# Patient Record
Sex: Female | Born: 1990 | Race: Black or African American | Hispanic: No | Marital: Single | Smoking: Never smoker
Health system: Southern US, Community
[De-identification: ages and names within clinical notes are randomized; demographics above are authoritative.]

## PROBLEM LIST (undated history)

## (undated) DIAGNOSIS — Z789 Other specified health status: Secondary | ICD-10-CM

---
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
## 2008-07-11 ENCOUNTER — Emergency Department: Payer: Self-pay | Admitting: Emergency Medicine

## 2008-08-25 ENCOUNTER — Emergency Department: Payer: Self-pay | Admitting: Emergency Medicine

## 2008-09-17 ENCOUNTER — Ambulatory Visit: Payer: Self-pay

## 2008-10-01 ENCOUNTER — Ambulatory Visit: Payer: Self-pay | Admitting: Gynecologic Oncology

## 2008-10-23 ENCOUNTER — Ambulatory Visit: Payer: Self-pay | Admitting: Gynecologic Oncology

## 2008-10-29 ENCOUNTER — Ambulatory Visit: Payer: Self-pay | Admitting: Gynecologic Oncology

## 2009-11-24 ENCOUNTER — Emergency Department: Payer: Self-pay | Admitting: Emergency Medicine

## 2010-01-31 ENCOUNTER — Emergency Department: Payer: Self-pay | Admitting: Emergency Medicine

## 2010-03-30 ENCOUNTER — Emergency Department: Payer: Self-pay | Admitting: Emergency Medicine

## 2010-08-10 ENCOUNTER — Observation Stay: Payer: Self-pay | Admitting: Obstetrics and Gynecology

## 2010-08-14 ENCOUNTER — Inpatient Hospital Stay: Payer: Self-pay

## 2010-11-17 ENCOUNTER — Emergency Department: Payer: Self-pay | Admitting: Emergency Medicine

## 2011-01-11 ENCOUNTER — Emergency Department: Payer: Self-pay | Admitting: Emergency Medicine

## 2011-04-12 ENCOUNTER — Emergency Department: Payer: Self-pay | Admitting: Emergency Medicine

## 2012-01-13 ENCOUNTER — Emergency Department: Payer: Self-pay

## 2012-09-19 ENCOUNTER — Emergency Department: Payer: Self-pay | Admitting: Emergency Medicine

## 2012-09-19 LAB — URINALYSIS, COMPLETE
Bilirubin,UR: NEGATIVE
Ketone: NEGATIVE
Nitrite: NEGATIVE
RBC,UR: 44 /HPF (ref 0–5)
Specific Gravity: 1.033 (ref 1.003–1.030)

## 2012-09-19 LAB — CBC
HCT: 38.9 % (ref 35.0–47.0)
HGB: 13.7 g/dL (ref 12.0–16.0)
MCH: 32.2 pg (ref 26.0–34.0)
MCHC: 35.2 g/dL (ref 32.0–36.0)
RBC: 4.25 10*6/uL (ref 3.80–5.20)
RDW: 12.7 % (ref 11.5–14.5)

## 2012-09-19 LAB — COMPREHENSIVE METABOLIC PANEL
Albumin: 3.8 g/dL (ref 3.4–5.0)
Alkaline Phosphatase: 88 U/L (ref 50–136)
Calcium, Total: 8.9 mg/dL (ref 8.5–10.1)
Co2: 22 mmol/L (ref 21–32)
Creatinine: 0.8 mg/dL (ref 0.60–1.30)
EGFR (African American): >60
EGFR (Non-African Amer.): >60
Osmolality: 277 (ref 275–301)
SGOT(AST): 18 U/L (ref 15–37)
SGPT (ALT): 25 U/L (ref 12–78)

## 2012-09-19 LAB — PREGNANCY, URINE: Pregnancy Test, Urine: NEGATIVE m[IU]/mL

## 2012-09-19 LAB — LIPASE, BLOOD: Lipase: 81 U/L (ref 73–393)

## 2013-12-05 ENCOUNTER — Emergency Department: Payer: Self-pay | Admitting: Emergency Medicine

## 2015-02-22 ENCOUNTER — Other Ambulatory Visit: Payer: Self-pay

## 2015-02-22 DIAGNOSIS — T148 Other injury of unspecified body region: Secondary | ICD-10-CM | POA: Insufficient documentation

## 2015-02-22 DIAGNOSIS — Z87891 Personal history of nicotine dependence: Secondary | ICD-10-CM | POA: Insufficient documentation

## 2015-02-22 DIAGNOSIS — Y9289 Other specified places as the place of occurrence of the external cause: Secondary | ICD-10-CM | POA: Insufficient documentation

## 2015-02-22 DIAGNOSIS — M545 Low back pain: Secondary | ICD-10-CM | POA: Insufficient documentation

## 2015-02-22 DIAGNOSIS — Z3202 Encounter for pregnancy test, result negative: Secondary | ICD-10-CM | POA: Insufficient documentation

## 2015-02-22 DIAGNOSIS — Y990 Civilian activity done for income or pay: Secondary | ICD-10-CM | POA: Insufficient documentation

## 2015-02-22 DIAGNOSIS — Y9389 Activity, other specified: Secondary | ICD-10-CM | POA: Insufficient documentation

## 2015-02-22 DIAGNOSIS — X58XXXA Exposure to other specified factors, initial encounter: Secondary | ICD-10-CM | POA: Insufficient documentation

## 2015-02-22 LAB — COMPREHENSIVE METABOLIC PANEL
ALBUMIN: 4 g/dL (ref 3.5–5.0)
ALK PHOS: 70 U/L (ref 38–126)
ALT: 36 U/L (ref 14–54)
AST: 29 U/L (ref 15–41)
Anion gap: 8 (ref 5–15)
BUN: 8 mg/dL (ref 6–20)
CHLORIDE: 108 mmol/L (ref 101–111)
CO2: 24 mmol/L (ref 22–32)
Calcium: 9 mg/dL (ref 8.9–10.3)
Creatinine, Ser: 1 mg/dL (ref 0.44–1.00)
GFR calc Af Amer: >60 mL/min (ref >60)
GFR calc non Af Amer: >60 mL/min (ref >60)
Glucose, Bld: 102 mg/dL — ABNORMAL HIGH (ref 65–99)
Potassium: 3.6 mmol/L (ref 3.5–5.1)
Sodium: 140 mmol/L (ref 135–145)
TOTAL PROTEIN: 8.1 g/dL (ref 6.5–8.1)
Total Bilirubin: 0.5 mg/dL (ref 0.3–1.2)

## 2015-02-22 LAB — CBC WITH DIFFERENTIAL/PLATELET
BASOS ABS: 0 10*3/uL (ref 0–0.1)
Basophils Relative: 1 %
EOS ABS: 1 10*3/uL — AB (ref 0–0.7)
EOS PCT: 15 %
HCT: 40.6 % (ref 35.0–47.0)
Hemoglobin: 13.7 g/dL (ref 12.0–16.0)
LYMPHS PCT: 29 %
Lymphs Abs: 1.8 10*3/uL (ref 1.0–3.6)
MCH: 31.4 pg (ref 26.0–34.0)
MCHC: 33.6 g/dL (ref 32.0–36.0)
MCV: 93.5 fL (ref 80.0–100.0)
Monocytes Absolute: 0.5 10*3/uL (ref 0.2–0.9)
Monocytes Relative: 8 %
NEUTROS PCT: 47 %
Neutro Abs: 3 10*3/uL (ref 1.4–6.5)
PLATELETS: 233 10*3/uL (ref 150–440)
RBC: 4.35 MIL/uL (ref 3.80–5.20)
RDW: 12.9 % (ref 11.5–14.5)
WBC: 6.4 10*3/uL (ref 3.6–11.0)

## 2015-02-22 LAB — URINALYSIS COMPLETE WITH MICROSCOPIC (ARMC ONLY)
BILIRUBIN URINE: NEGATIVE
Bacteria, UA: NONE SEEN
Glucose, UA: NEGATIVE mg/dL
HGB URINE DIPSTICK: NEGATIVE
KETONES UR: NEGATIVE mg/dL
Nitrite: NEGATIVE
Protein, ur: NEGATIVE mg/dL
Specific Gravity, Urine: 1.025 (ref 1.005–1.030)
pH: 5 (ref 5.0–8.0)

## 2015-02-22 LAB — LIPASE, BLOOD: Lipase: 33 U/L (ref 22–51)

## 2015-02-22 NOTE — ED Notes (Signed)
Pt to ED c/o generalized body aches and upper abdominal pain. Pt is A&O, speaking clearly. Denies any n/v/d.

## 2015-02-23 ENCOUNTER — Emergency Department
Admission: EM | Admit: 2015-02-23 | Discharge: 2015-02-23 | Disposition: A | Discharge status: Home / Self Care | Payer: Self-pay | Attending: Emergency Medicine | Admitting: Emergency Medicine

## 2015-02-23 ENCOUNTER — Encounter: Payer: Self-pay | Admitting: Emergency Medicine

## 2015-02-23 DIAGNOSIS — T148XXA Other injury of unspecified body region, initial encounter: Secondary | ICD-10-CM

## 2015-02-23 LAB — PREGNANCY, URINE: Preg Test, Ur: NEGATIVE

## 2015-02-23 NOTE — ED Provider Notes (Signed)
[facility] Emergency Department Provider Note  ____________________________________________  Time seen: Approximately 12:27 AM  I have reviewed the triage vital signs and the nursing notes.   HISTORY  Chief Complaint Abdominal Pain and Muscle Pain    HPI Andrea Rivera is a 24 y.o. female with no significant past medical history presents with 2 weeks of intermittent mild to moderate upper abdominal/bilateral lower rib pain.Chest he complains of mild intermittent pain in her extremities and her lower back that she believes is related to the heavy lifting that she does not work.  She states that actually she does not have any pain at all right now but just wanted to get checked out.  She also states that she is worried she might be pregnant because she missed her Depo-Provera shot.  She denies fever/chills, chest pain, shortness of breath, lower abdominal pain, and dysuria.  The onset of her symptoms is gradual, the timing is intermittent, and the severity is mild.  Describes the back pain as aching.  History reviewed. No pertinent past medical history.  There are no active problems to display for this patient.   History reviewed. No pertinent past surgical history.  No current outpatient prescriptions on file.  Allergies Review of patient's allergies indicates no known allergies.  History reviewed. No pertinent family history.  Social History History  Substance Use Topics  . Smoking status: Former Research scientist (life sciences)  . Smokeless tobacco: Not on file  . Alcohol Use: No    Review of Systems Constitutional: No fever/chills Eyes: No visual changes. ENT: No sore throat. Cardiovascular: Denies chest pain. Respiratory: Denies shortness of breath. Gastrointestinal: No abdominal pain.  No nausea, no vomiting.  No diarrhea.  No constipation. Genitourinary: Negative for dysuria. Musculoskeletal: Intermittent aching lower back pain Skin: Negative for  rash. Neurological: Negative for headaches, focal weakness or numbness.  10-point ROS otherwise negative.  ____________________________________________   PHYSICAL EXAM:  VITAL SIGNS: ED Triage Vitals  Enc Vitals Group     BP 02/22/15 1950 135/75 mmHg     Pulse Rate 02/22/15 1950 76     Resp 02/22/15 1950 18     Temp 02/22/15 1950 98.1 F (36.7 C)     Temp Source 02/22/15 1950 Oral     SpO2 02/22/15 1950 100 %     Weight 02/22/15 1950 200 lb (90.719 kg)     Height 02/22/15 1950 5\' 4"  (1.626 m)     Head Cir --      Peak Flow --      Pain Score 02/22/15 1951 8     Pain Loc --      Pain Edu? --      Excl. in [place]? --     Constitutional: Alert and oriented. Well appearing and in no acute distress. Eyes: Conjunctivae are normal. PERRL. EOMI. Head: Atraumatic. Nose: No congestion/rhinnorhea. Mouth/Throat: Mucous membranes are moist.  Oropharynx non-erythematous. Neck: No stridor.   Cardiovascular: Normal rate, regular rhythm. Grossly normal heart sounds.  Good peripheral circulation. Respiratory: Normal respiratory effort.  No retractions. Lungs CTAB. Gastrointestinal: Soft and nontender. No distention. No abdominal bruits. No CVA tenderness. Musculoskeletal: No lower extremity tenderness nor edema.  No joint effusions. Neurologic:  Normal speech and language. No gross focal neurologic deficits are appreciated. Speech is normal. Skin:  Skin is warm, dry and intact. No rash noted. Psychiatric: Mood and affect are normal. Speech and behavior are normal.  ____________________________________________   LABS (all labs ordered are listed, but only abnormal results  are displayed)  Labs Reviewed  CBC WITH DIFFERENTIAL/PLATELET - Abnormal; Notable for the following:    Eosinophils Absolute 1.0 (*)    All other components within normal limits  COMPREHENSIVE METABOLIC PANEL - Abnormal; Notable for the following:    Glucose, Bld 102 (*)    All other components within normal limits   URINALYSIS COMPLETEWITH MICROSCOPIC ([facility] ONLY) - Abnormal; Notable for the following:    Color, Urine YELLOW (*)    APPearance CLOUDY (*)    Leukocytes, UA 1+ (*)    Squamous Epithelial / LPF 6-30 (*)    All other components within normal limits  URINE CULTURE  LIPASE, BLOOD  PREGNANCY, URINE  POC URINE PREG, ED   ____________________________________________  EKG  ED ECG REPORT I, Caiden Arteaga, the attending physician, personally viewed and interpreted this ECG.   Date: 02/23/2015  EKG Time: 20:11  Rate: 70  Rhythm: normal sinus rhythm  Axis: Normal  Intervals:Incomplete right bundle branch block  ST&T Change: No indication of acute ischemia  ____________________________________________  RADIOLOGY  Not indicated  ____________________________________________   PROCEDURES  Procedure(s) performed: None  Critical Care performed: No ____________________________________________   INITIAL IMPRESSION / ASSESSMENT AND PLAN / ED COURSE  Pertinent labs & imaging results that were available during my care of the patient were reviewed by me and considered in my medical decision making (see chart for details).  The patient is well-appearing and in no acute distress with no complaints at this time.  She needed reassurance that she has no acute medical problems at this time, and I assured her that she does not have an emergent medical condition.  Her urinalysis was contaminated with squamous cells and I do not believe that she has an actual UTI given no other symptoms.  I am sending the urine to culture rather than treating her empirically.  I discussed this with the patient and she understands.  I advised following up with a primary care doctor to talk about her health maintenance in detail  ____________________________________________  FINAL CLINICAL IMPRESSION(S) / ED DIAGNOSES  Final diagnoses:  Muscle strain      NEW MEDICATIONS STARTED DURING THIS  VISIT:  There are no discharge medications for this patient.    Hinda Kehr, MD 02/24/15 [phone]

## 2015-02-23 NOTE — ED Notes (Signed)
Pt states pain intermittently x 2 weeks. Pt states upper abdominal pain/ bilateral lower rib pain. Pt states multiple complaints including bilateral lower leg pain, foot pain, shoulder pain and possible pregnancy related to missing depo shot. Pt states she has recently been lifting heavy items at work and believes the pain is related to her range of motion required by her work and the amount of time she spends standing but she "wanted to get checked out because" she "never does".

## 2015-02-23 NOTE — Discharge Instructions (Signed)
Muscle Strain A muscle strain (pulled muscle) happens when a muscle is stretched beyond normal length. It happens when a sudden, violent force stretches your muscle too far. Usually, a few of the fibers in your muscle are torn. Muscle strain is common in athletes. Recovery usually takes 1-2 weeks. Complete healing takes 5-6 weeks.  HOME CARE   Follow the PRICE method of treatment to help your injury get better. Do this the first 2-3 days after the injury:  Protect. Protect the muscle to keep it from getting injured again.  Rest. Limit your activity and rest the injured body part.  Ice. Put ice in a plastic bag. Place a towel between your skin and the bag. Then, apply the ice and leave it on from 15-20 minutes each hour. After the third day, switch to moist heat packs.  Compression. Use a splint or elastic bandage on the injured area for comfort. Do not put it on too tightly.  Elevate. Keep the injured body part above the level of your heart.  Only take medicine as told by your doctor.  Warm up before doing exercise to prevent future muscle strains. GET HELP IF:   You have more pain or puffiness (swelling) in the injured area.  You feel numbness, tingling, or notice a loss of strength in the injured area. MAKE SURE YOU:   Understand these instructions.  Will watch your condition.  Will get help right away if you are not doing well or get worse. Document Released: 05/26/2008 Document Revised: 06/07/2013 Document Reviewed: 03/16/2013 [facility] Patient Information 2015 [place], [place]. This information is not intended to replace advice given to you by your health care provider. Make sure you discuss any questions you have with your health care provider.

## 2015-02-24 LAB — URINE CULTURE: SPECIAL REQUESTS: NORMAL

## 2015-02-26 LAB — POCT PREGNANCY, URINE: PREG TEST UR: NEGATIVE

## 2015-09-01 NOTE — L&D Delivery Note (Signed)
Delivery Note At 6:45 AM a viable female was delivered via Vaginal, Spontaneous Delivery (Presentation: ;  ).  APGAR: 8, 9; weight 7 lb 14 oz (3572 g).   Placenta status: intact, .  Cord:loose nuchal reduced   with the following complications: None .  terminal variable decels . Female crying on delivery . Delayed cord clamping  Anesthesia:  cle Episiotomy:  none Lacerations:  First degree Suture Repair: 3.0 vicryl Est. Blood Loss (mL):  300 cc  Mom to postpartum.  Baby to Couplet care / Skin to Skin.  Mariette Cowley 06/07/2016, 7:00 AM

## 2015-12-18 ENCOUNTER — Encounter: Payer: Self-pay | Admitting: Emergency Medicine

## 2015-12-18 ENCOUNTER — Emergency Department
Admission: EM | Admit: 2015-12-18 | Discharge: 2015-12-18 | Disposition: A | Discharge status: Home / Self Care | Payer: Medicaid Other | Attending: Emergency Medicine | Admitting: Emergency Medicine

## 2015-12-18 ENCOUNTER — Emergency Department: Payer: Medicaid Other

## 2015-12-18 DIAGNOSIS — R1030 Lower abdominal pain, unspecified: Secondary | ICD-10-CM

## 2015-12-18 DIAGNOSIS — Z3A15 15 weeks gestation of pregnancy: Secondary | ICD-10-CM | POA: Diagnosis not present

## 2015-12-18 DIAGNOSIS — B9689 Other specified bacterial agents as the cause of diseases classified elsewhere: Secondary | ICD-10-CM

## 2015-12-18 DIAGNOSIS — R109 Unspecified abdominal pain: Secondary | ICD-10-CM

## 2015-12-18 DIAGNOSIS — A599 Trichomoniasis, unspecified: Secondary | ICD-10-CM | POA: Diagnosis not present

## 2015-12-18 DIAGNOSIS — O26899 Other specified pregnancy related conditions, unspecified trimester: Secondary | ICD-10-CM

## 2015-12-18 DIAGNOSIS — O23591 Infection of other part of genital tract in pregnancy, first trimester: Secondary | ICD-10-CM | POA: Diagnosis not present

## 2015-12-18 DIAGNOSIS — Z87891 Personal history of nicotine dependence: Secondary | ICD-10-CM | POA: Diagnosis not present

## 2015-12-18 DIAGNOSIS — N760 Acute vaginitis: Secondary | ICD-10-CM

## 2015-12-18 LAB — URINALYSIS COMPLETE WITH MICROSCOPIC (ARMC ONLY)
Bilirubin Urine: NEGATIVE
Glucose, UA: NEGATIVE mg/dL
HGB URINE DIPSTICK: NEGATIVE
NITRITE: NEGATIVE
PH: 6 (ref 5.0–8.0)
PROTEIN: NEGATIVE mg/dL
SPECIFIC GRAVITY, URINE: 1.025 (ref 1.005–1.030)

## 2015-12-18 LAB — WET PREP, GENITAL
SPERM: NONE SEEN
Yeast Wet Prep HPF POC: NONE SEEN

## 2015-12-18 LAB — HCG, QUANTITATIVE, PREGNANCY: hCG, Beta Chain, Quant, S: 54817 m[IU]/mL — ABNORMAL HIGH (ref <5)

## 2015-12-18 LAB — CHLAMYDIA/NGC RT PCR (ARMC ONLY)
CHLAMYDIA TR: NOT DETECTED
N gonorrhoeae: NOT DETECTED

## 2015-12-18 MED ORDER — METRONIDAZOLE 500 MG PO TABS: 500.0000 mg | ORAL_TABLET | Freq: Once | ORAL | Status: DC

## 2015-12-18 MED ORDER — METRONIDAZOLE 500 MG PO TABS: 500.0000 mg | ORAL_TABLET | Freq: Two times a day (BID) | ORAL | Status: AC

## 2015-12-18 MED ORDER — ACETAMINOPHEN 325 MG PO TABS
650.0000 mg | ORAL_TABLET | Freq: Once | ORAL | Status: DC
  Filled 2015-12-18: qty 2

## 2015-12-18 NOTE — ED Notes (Signed)
Abdominal pain-mid to lower. Denies bleeding or discharge. Approx 3 mo pregnant.

## 2015-12-18 NOTE — ED Notes (Addendum)
Patient ambulatory to triage with steady gait, without difficulty or distress noted; pt reports went on carnival ride and since has had mid lower abd pain, no bleeding or discharge; pt approx 55months pregnant; G2 P1; pt at [facility] with no complications

## 2015-12-18 NOTE — ED Provider Notes (Signed)
[facility] Emergency Department Provider Note  ____________________________________________  Time seen: Approximately 258 AM  I have reviewed the triage vital signs and the nursing notes.   HISTORY  Chief Complaint Abdominal Pain    HPI Andrea Rivera is a 25 y.o. female who comes into the hospital today with some lower abdominal pain. The patient reports that started today and is sharp and crampy. She reports that she is 52-months pregnant and goes to [place]. The patient is a G2 P1 001. She reports that her pain in his lower abdomen and is currently a 5 out of 10 in intensity. She denies any pain with urination and reports that she has not taken anything for her pain. She reports that she did not call [facility] as the pain started later in the evening. The patient does not have any vaginal discharge she reports that is abnormal. She's had no fevers no back pain no chest pain or shortness of breath. The patient reports she is not sure what going on she decided to come in and get checked out. She is having no vaginal bleeding as well.   History reviewed. No pertinent past medical history.  There are no active problems to display for this patient.   History reviewed. No pertinent past surgical history.  Current Outpatient Rx  Name  Route  Sig  Dispense  Refill  . Prenat-FeFum-FePo-FA-DHA w/o A (PROVIDA DHA) 16-16-1.25-110 MG CAPS   Oral   Take 1 capsule by mouth daily.      0   . metroNIDAZOLE (FLAGYL) 500 MG tablet   Oral   Take 1 tablet (500 mg total) by mouth 2 (two) times daily.   14 tablet   0     Allergies Review of patient's allergies indicates no known allergies.  No family history on file.  Social History Social History  Substance Use Topics  . Smoking status: Former Research scientist (life sciences)  . Smokeless tobacco: None  . Alcohol Use: No    Review of Systems Constitutional: No fever/chills Eyes: No visual changes. ENT: No sore  throat. Cardiovascular: Denies chest pain. Respiratory: Denies shortness of breath. Gastrointestinal:abdominal pain.  No nausea, no vomiting.  No diarrhea.  No constipation. Genitourinary: Negative for dysuria. Musculoskeletal: Negative for back pain. Skin: Negative for rash. Neurological: Negative for headaches, focal weakness or numbness.  10-point ROS otherwise negative.  ____________________________________________   PHYSICAL EXAM:  VITAL SIGNS: ED Triage Vitals  Enc Vitals Group     BP -- 106/71     Pulse -- 73     Resp -- 16     Temp -- 98.4     Temp src --      SpO2 -- 100%     Weight --      Height --      Head Cir --      Peak Flow --      Pain Score 12/18/15 0032 10     Pain Loc --      Pain Edu? --      Excl. in [place]? --     Constitutional: Alert and oriented. Well appearing and in no acute distress. Eyes: Conjunctivae are normal. PERRL. EOMI. Head: Atraumatic. Nose: No congestion/rhinnorhea. Mouth/Throat: Mucous membranes are moist.  Oropharynx non-erythematous. Cardiovascular: Normal rate, regular rhythm. Grossly normal heart sounds.  Good peripheral circulation. Respiratory: Normal respiratory effort.  No retractions. Lungs CTAB. Gastrointestinal: Soft and nontender. No distention. Positive bowel sounds Genitourinary: normal external genitalia with some white  vaginal discharge, no tenderness to palpation  Musculoskeletal: No lower extremity tenderness nor edema.   Neurologic:  Normal speech and language.  Skin:  Skin is warm, dry and intact.  Psychiatric: Mood and affect are normal.  ____________________________________________   LABS (all labs ordered are listed, but only abnormal results are displayed)  Labs Reviewed  WET PREP, GENITAL - Abnormal; Notable for the following:    Trich, Wet Prep PRESENT (*)    Clue Cells Wet Prep HPF POC PRESENT (*)    WBC, Wet Prep HPF POC MODERATE (*)    All other components within normal limits  URINALYSIS  COMPLETEWITH MICROSCOPIC ([facility] ONLY) - Abnormal; Notable for the following:    Color, Urine YELLOW (*)    APPearance CLEAR (*)    Ketones, ur TRACE (*)    Leukocytes, UA 1+ (*)    Bacteria, UA RARE (*)    Squamous Epithelial / LPF 0-5 (*)    All other components within normal limits  HCG, QUANTITATIVE, PREGNANCY - Abnormal; Notable for the following:    hCG, Beta Chain, Quant, S 54817 (*)    All other components within normal limits  CHLAMYDIA/NGC RT PCR ([facility] ONLY)   ____________________________________________  EKG  none ____________________________________________  RADIOLOGY  Ultrasound: Single live intrauterine pregnancy estimated gestational age is 15 weeks 3 days, placenta is low lying ____________________________________________   PROCEDURES  Procedure(s) performed: None  Critical Care performed: No  ____________________________________________   INITIAL IMPRESSION / ASSESSMENT AND PLAN / ED COURSE  Pertinent labs & imaging results that were available during my care of the patient were reviewed by me and considered in my medical decision making (see chart for details).  This is a 25 year old female who is 3 months pregnant comes in with some mid abdominal pain. The patient's ultrasound is unremarkable but I did perform a wet prep as well as GC and chlamydia. The patient does have clue cells as well as Trichomonas present on her wet prep. I did give the patient dose of metronidazole and informed her that she does have Trichomonas. I also gave her a dose of Tylenol but her pain is improved at this time. I will have her follow back up with OB/GYN for further evaluation and treatment of her bacterial vaginosis as well as trichomoniasis. ____________________________________________   FINAL CLINICAL IMPRESSION(S) / ED DIAGNOSES  Final diagnoses:  Abdominal pain in pregnancy  Trichomonas infection  Bacterial vaginosis      Loney Hering,  MD 12/18/15 [phone]

## 2015-12-18 NOTE — Discharge Instructions (Signed)
Bacterial Vaginosis Bacterial vaginosis is a vaginal infection that occurs when the normal balance of bacteria in the vagina is disrupted. It results from an overgrowth of certain bacteria. This is the most common vaginal infection in women of childbearing age. Treatment is important to prevent complications, especially in pregnant women, as it can cause a premature delivery. CAUSES  Bacterial vaginosis is caused by an increase in harmful bacteria that are normally present in smaller amounts in the vagina. Several different kinds of bacteria can cause bacterial vaginosis. However, the reason that the condition develops is not fully understood. RISK FACTORS Certain activities or behaviors can put you at an increased risk of developing bacterial vaginosis, including:  Having a new sex partner or multiple sex partners.  Douching.  Using an intrauterine device (IUD) for contraception. Women do not get bacterial vaginosis from toilet seats, bedding, swimming pools, or contact with objects around them. SIGNS AND SYMPTOMS  Some women with bacterial vaginosis have no signs or symptoms. Common symptoms include:  Grey vaginal discharge.  A fishlike odor with discharge, especially after sexual intercourse.  Itching or burning of the vagina and vulva.  Burning or pain with urination. DIAGNOSIS  Your health care provider will take a medical history and examine the vagina for signs of bacterial vaginosis. A sample of vaginal fluid may be taken. Your health care provider will look at this sample under a microscope to check for bacteria and abnormal cells. A vaginal pH test may also be done.  TREATMENT  Bacterial vaginosis may be treated with antibiotic medicines. These may be given in the form of a pill or a vaginal cream. A second round of antibiotics may be prescribed if the condition comes back after treatment. Because bacterial vaginosis increases your risk for sexually transmitted diseases, getting  treated can help reduce your risk for chlamydia, gonorrhea, HIV, and herpes. HOME CARE INSTRUCTIONS   Only take over-the-counter or prescription medicines as directed by your health care provider.  If antibiotic medicine was prescribed, take it as directed. Make sure you finish it even if you start to feel better.  Tell all sexual partners that you have a vaginal infection. They should see their health care provider and be treated if they have problems, such as a mild rash or itching.  During treatment, it is important that you follow these instructions:  Avoid sexual activity or use condoms correctly.  Do not douche.  Avoid alcohol as directed by your health care provider.  Avoid breastfeeding as directed by your health care provider. SEEK MEDICAL CARE IF:   Your symptoms are not improving after 3 days of treatment.  You have increased discharge or pain.  You have a fever. MAKE SURE YOU:   Understand these instructions.  Will watch your condition.  Will get help right away if you are not doing well or get worse. FOR MORE INFORMATION  Centers for Disease Control and Prevention, Division of STD Prevention: [employer] American Sexual Health Association (ASHA): www.ashastd.org    This information is not intended to replace advice given to you by your health care provider. Make sure you discuss any questions you have with your health care provider.   Document Released: 08/17/2005 Document Revised: 09/07/2014 Document Reviewed: 03/29/2013 Elsevier Interactive Patient Education 2016 [employer].  Trichomoniasis Trichomoniasis is an infection caused by an organism called Trichomonas. The infection can affect both women and men. In women, the outer female genitalia and the vagina are affected. In men, the penis is mainly  affected, but the prostate and other reproductive organs can also be involved. Trichomoniasis is a sexually transmitted infection (STI) and is most often  passed to another person through sexual contact.  RISK FACTORS  Having unprotected sexual intercourse.  Having sexual intercourse with an infected partner. SIGNS AND SYMPTOMS  Symptoms of trichomoniasis in women include:  Abnormal gray-green frothy vaginal discharge.  Itching and irritation of the vagina.  Itching and irritation of the area outside the vagina. Symptoms of trichomoniasis in men include:   Penile discharge with or without pain.  Pain during urination. This results from inflammation of the urethra. DIAGNOSIS  Trichomoniasis may be found during a Pap test or physical exam. Your health care provider may use one of the following methods to help diagnose this infection:  Testing the pH of the vagina with a test tape.  Using a vaginal swab test that checks for the Trichomonas organism. A test is available that provides results within a few minutes.  Examining a urine sample.  Testing vaginal secretions. Your health care provider may test you for other STIs, including HIV. TREATMENT   You may be given medicine to fight the infection. Women should inform their health care provider if they could be or are pregnant. Some medicines used to treat the infection should not be taken during pregnancy.  Your health care provider may recommend over-the-counter medicines or creams to decrease itching or irritation.  Your sexual partner will need to be treated if infected.  Your health care provider may test you for infection again 3 months after treatment. HOME CARE INSTRUCTIONS   Take medicines only as directed by your health care provider.  Take over-the-counter medicine for itching or irritation as directed by your health care provider.  Do not have sexual intercourse while you have the infection.  Women should not douche or wear tampons while they have the infection.  Discuss your infection with your partner. Your partner may have gotten the infection from you, or you  may have gotten it from your partner.  Have your sex partner get examined and treated if necessary.  Practice safe, informed, and protected sex.  See your health care provider for other STI testing. SEEK MEDICAL CARE IF:   You still have symptoms after you finish your medicine.  You develop abdominal pain.  You have pain when you urinate.  You have bleeding after sexual intercourse.  You develop a rash.  Your medicine makes you sick or makes you throw up (vomit). MAKE SURE YOU:  Understand these instructions.  Will watch your condition.  Will get help right away if you are not doing well or get worse.   This information is not intended to replace advice given to you by your health care provider. Make sure you discuss any questions you have with your health care provider.   Document Released: 02/10/2001 Document Revised: 09/07/2014 Document Reviewed: 05/29/2013 Elsevier Interactive Patient Education 2016 Elsevier Inc.  Abdominal Pain, Adult Many things can cause belly (abdominal) pain. Most times, the belly pain is not dangerous. Many cases of belly pain can be watched and treated at home. HOME CARE   Do not take medicines that help you go poop (laxatives) unless told to by your doctor.  Only take medicine as told by your doctor.  Eat or drink as told by your doctor. Your doctor will tell you if you should be on a special diet. GET HELP IF:  You do not know what is causing your belly  pain.  You have belly pain while you are sick to your stomach (nauseous) or have runny poop (diarrhea).  You have pain while you pee or poop.  Your belly pain wakes you up at night.  You have belly pain that gets worse or better when you eat.  You have belly pain that gets worse when you eat fatty foods.  You have a fever. GET HELP RIGHT AWAY IF:   The pain does not go away within 2 hours.  You keep throwing up (vomiting).  The pain changes and is only in the right or left  part of the belly.  You have bloody or tarry looking poop. MAKE SURE YOU:   Understand these instructions.  Will watch your condition.  Will get help right away if you are not doing well or get worse.   This information is not intended to replace advice given to you by your health care provider. Make sure you discuss any questions you have with your health care provider.   Document Released: 02/03/2008 Document Revised: 09/07/2014 Document Reviewed: 04/26/2013 Elsevier Interactive Patient Education [employer].

## 2016-05-12 LAB — OB RESULTS CONSOLE HEPATITIS B SURFACE ANTIGEN: HEP B S AG: NEGATIVE

## 2016-05-12 LAB — OB RESULTS CONSOLE GBS: STREP GROUP B AG: POSITIVE

## 2016-05-12 LAB — OB RESULTS CONSOLE GC/CHLAMYDIA
Chlamydia: NEGATIVE
Gonorrhea: NEGATIVE

## 2016-05-12 LAB — OB RESULTS CONSOLE RPR
RPR: NONREACTIVE
RPR: NONREACTIVE

## 2016-05-12 LAB — OB RESULTS CONSOLE HIV ANTIBODY (ROUTINE TESTING): HIV: NONREACTIVE

## 2016-05-12 LAB — OB RESULTS CONSOLE RUBELLA ANTIBODY, IGM: RUBELLA: IMMUNE

## 2016-05-12 LAB — OB RESULTS CONSOLE VARICELLA ZOSTER ANTIBODY, IGG: VARICELLA IGG: IMMUNE

## 2016-06-06 ENCOUNTER — Inpatient Hospital Stay
Admission: EM | Admit: 2016-06-06 | Discharge: 2016-06-08 | DRG: 775 | Disposition: A | Discharge status: Home / Self Care | Payer: Medicaid Other | Attending: Obstetrics and Gynecology | Admitting: Obstetrics and Gynecology

## 2016-06-06 DIAGNOSIS — Z87891 Personal history of nicotine dependence: Secondary | ICD-10-CM

## 2016-06-06 DIAGNOSIS — Z3A39 39 weeks gestation of pregnancy: Secondary | ICD-10-CM | POA: Diagnosis not present

## 2016-06-06 DIAGNOSIS — Z3483 Encounter for supervision of other normal pregnancy, third trimester: Secondary | ICD-10-CM | POA: Diagnosis present

## 2016-06-06 DIAGNOSIS — O479 False labor, unspecified: Secondary | ICD-10-CM | POA: Diagnosis present

## 2016-06-06 HISTORY — DX: Other specified health status: Z78.9

## 2016-06-06 MED ORDER — ONDANSETRON HCL 4 MG/2ML IJ SOLN: 4.0000 mg | Freq: Four times a day (QID) | INTRAMUSCULAR | Status: DC | PRN

## 2016-06-06 MED ORDER — BUTORPHANOL TARTRATE 1 MG/ML IJ SOLN: 2.0000 mg | INTRAMUSCULAR | Status: DC | PRN

## 2016-06-06 MED ORDER — SODIUM CHLORIDE 0.9 % IV SOLN: 2.0000 g | Freq: Once | INTRAVENOUS | Status: DC

## 2016-06-06 MED ORDER — SODIUM CHLORIDE 0.9 % IV SOLN
2.0000 g | Freq: Once | INTRAVENOUS | Status: AC
  Administered 2016-06-06: 2 g via INTRAVENOUS

## 2016-06-06 MED ORDER — LIDOCAINE HCL (PF) 1 % IJ SOLN: 30.0000 mL | INTRAMUSCULAR | Status: DC | PRN

## 2016-06-06 MED ORDER — ACETAMINOPHEN 325 MG PO TABS: 650.0000 mg | ORAL_TABLET | ORAL | Status: DC | PRN

## 2016-06-06 MED ORDER — SOD CITRATE-CITRIC ACID 500-334 MG/5ML PO SOLN
30.0000 mL | ORAL | Status: DC | PRN
  Filled 2016-06-06: qty 30

## 2016-06-06 MED ORDER — OXYTOCIN BOLUS FROM INFUSION: 500.0000 mL | Freq: Once | INTRAVENOUS | Status: DC

## 2016-06-06 MED ORDER — LACTATED RINGERS IV SOLN: INTRAVENOUS | Status: DC

## 2016-06-06 MED ORDER — SODIUM CHLORIDE 0.9 % IV SOLN
INTRAVENOUS | Status: AC
  Administered 2016-06-06: 2 g via INTRAVENOUS
  Filled 2016-06-06: qty 2000

## 2016-06-06 MED ORDER — OXYTOCIN 40 UNITS IN LACTATED RINGERS INFUSION - SIMPLE MED: 2.5000 [IU]/h | INTRAVENOUS | Status: DC

## 2016-06-06 MED ORDER — LACTATED RINGERS IV SOLN: 500.0000 mL | INTRAVENOUS | Status: DC | PRN

## 2016-06-06 MED ORDER — SODIUM CHLORIDE 0.9 % IV SOLN
1.0000 g | INTRAVENOUS | Status: DC
  Administered 2016-06-07: 1 g via INTRAVENOUS
  Filled 2016-06-06 (×5): qty 1000

## 2016-06-06 MED ORDER — BUTORPHANOL TARTRATE 1 MG/ML IJ SOLN
INTRAMUSCULAR | Status: AC
  Administered 2016-06-06: 1 mg via INTRAVENOUS
  Filled 2016-06-06: qty 1

## 2016-06-06 MED ORDER — BUTORPHANOL TARTRATE 1 MG/ML IJ SOLN
1.0000 mg | INTRAMUSCULAR | Status: DC
  Administered 2016-06-06: 1 mg via INTRAVENOUS

## 2016-06-06 NOTE — H&P (Signed)
Andrea Rivera is a 25 y.o. female G2P1 at 39+6 weeks  presenting for labor , . OB History    Gravida Para Term Preterm AB Living   1             SAB TAB Ectopic Multiple Live Births                 No past medical history on file. No past surgical history on file. Family History: family history is not on file. Social History:  reports that she has quit smoking. She does not have any smokeless tobacco history on file. She reports that she does not drink alcohol. Her drug history is not on file.     Maternal Diabetes: No Genetic Screening: Normal Maternal Ultrasounds/Referrals: Normal Fetal Ultrasounds or other Referrals:  None Maternal Substance Abuse:  No Significant Maternal Medications:  None Significant Maternal Lab Results:  None Other Comments:  None  ROS History Dilation: 4 Effacement (%): 100 Station: 0 Exam by:: TJS Blood pressure 130/69, pulse 93, temperature 98 F (36.7 C), temperature source Oral, resp. rate 20, last menstrual period 08/19/2015. Exam  Lungs CTA  cv RRR adb soft Nt  cx 4/ c /0 vtx Physical Exam  Prenatal labs: ABO, Rh:  b+ Antibody:  neg Rubella:  imm RPR:   neg HBsAg:    HIV:   neg GBS:   +  Assessment/Plan: Labor  Reassuring fetal monitoring  Stadol 1-2 mg q 3 hr prn pain  Start ampicillin  For GBS prophylaxis Cle offered    SCHERMERHORN,THOMAS 06/06/2016, 11:12 PM

## 2016-06-06 NOTE — OB Triage Note (Signed)
Patient came in c/o contractions starting this afternoon. Reports scant vaginal bleeding this afternoon. Positive fetal movement.

## 2016-06-07 ENCOUNTER — Encounter: Payer: Self-pay | Admitting: Anesthesiology

## 2016-06-07 ENCOUNTER — Inpatient Hospital Stay: Payer: Medicaid Other | Admitting: Anesthesiology

## 2016-06-07 LAB — TYPE AND SCREEN
ABO/RH(D): B POS
ANTIBODY SCREEN: NEGATIVE

## 2016-06-07 LAB — CBC
HCT: 32.8 % — ABNORMAL LOW (ref 35.0–47.0)
HEMOGLOBIN: 11.6 g/dL — AB (ref 12.0–16.0)
MCH: 32.2 pg (ref 26.0–34.0)
MCHC: 35.4 g/dL (ref 32.0–36.0)
MCV: 91.1 fL (ref 80.0–100.0)
Platelets: 177 10*3/uL (ref 150–440)
RBC: 3.6 MIL/uL — ABNORMAL LOW (ref 3.80–5.20)
RDW: 13.6 % (ref 11.5–14.5)
WBC: 9.6 10*3/uL (ref 3.6–11.0)

## 2016-06-07 MED ORDER — HYDROCODONE-ACETAMINOPHEN 5-325 MG PO TABS
1.0000 | ORAL_TABLET | Freq: Four times a day (QID) | ORAL | Status: DC | PRN
  Administered 2016-06-07: 1 via ORAL
  Filled 2016-06-07: qty 1

## 2016-06-07 MED ORDER — MAGNESIUM HYDROXIDE 400 MG/5ML PO SUSP: 30.0000 mL | ORAL | Status: DC | PRN

## 2016-06-07 MED ORDER — BUPIVACAINE HCL (PF) 0.25 % IJ SOLN
INTRAMUSCULAR | Status: DC | PRN
  Administered 2016-06-07: 10 mL via EPIDURAL

## 2016-06-07 MED ORDER — LIDOCAINE-EPINEPHRINE (PF) 1.5 %-1:200000 IJ SOLN
INTRAMUSCULAR | Status: DC | PRN
  Administered 2016-06-07: 3 mL via PERINEURAL

## 2016-06-07 MED ORDER — ONDANSETRON HCL 4 MG/2ML IJ SOLN: 4.0000 mg | INTRAMUSCULAR | Status: DC | PRN

## 2016-06-07 MED ORDER — BENZOCAINE-MENTHOL 20-0.5 % EX AERO: 1.0000 "application " | INHALATION_SPRAY | CUTANEOUS | Status: DC | PRN

## 2016-06-07 MED ORDER — PRENATAL MULTIVITAMIN CH
1.0000 | ORAL_TABLET | Freq: Every day | ORAL | Status: DC
  Administered 2016-06-07 – 2016-06-08 (×2): 1 via ORAL
  Filled 2016-06-07 (×2): qty 1

## 2016-06-07 MED ORDER — AMMONIA AROMATIC IN INHA
RESPIRATORY_TRACT | Status: AC
  Filled 2016-06-07: qty 10

## 2016-06-07 MED ORDER — ACETAMINOPHEN 325 MG PO TABS: 650.0000 mg | ORAL_TABLET | ORAL | Status: DC | PRN

## 2016-06-07 MED ORDER — FENTANYL 2.5 MCG/ML W/ROPIVACAINE 0.2% IN NS 100 ML EPIDURAL INFUSION (ARMC-ANES)
EPIDURAL | Status: AC
  Filled 2016-06-07: qty 100

## 2016-06-07 MED ORDER — MEASLES, MUMPS & RUBELLA VAC ~~LOC~~ INJ
0.5000 mL | INJECTION | Freq: Once | SUBCUTANEOUS | Status: DC
  Filled 2016-06-07: qty 0.5

## 2016-06-07 MED ORDER — PHENYLEPHRINE 40 MCG/ML (10ML) SYRINGE FOR IV PUSH (FOR BLOOD PRESSURE SUPPORT)
80.0000 ug | PREFILLED_SYRINGE | INTRAVENOUS | Status: DC | PRN
  Filled 2016-06-07: qty 5

## 2016-06-07 MED ORDER — INFLUENZA VAC SPLIT QUAD 0.5 ML IM SUSY: 0.5000 mL | PREFILLED_SYRINGE | INTRAMUSCULAR | Status: DC

## 2016-06-07 MED ORDER — EPHEDRINE 5 MG/ML INJ
10.0000 mg | INTRAVENOUS | Status: DC | PRN
  Filled 2016-06-07: qty 2

## 2016-06-07 MED ORDER — LACTATED RINGERS IV SOLN: 500.0000 mL | Freq: Once | INTRAVENOUS | Status: DC

## 2016-06-07 MED ORDER — MISOPROSTOL 200 MCG PO TABS
ORAL_TABLET | ORAL | Status: AC
  Filled 2016-06-07: qty 4

## 2016-06-07 MED ORDER — FERROUS SULFATE 325 (65 FE) MG PO TABS
325.0000 mg | ORAL_TABLET | Freq: Two times a day (BID) | ORAL | Status: DC
  Administered 2016-06-08: 325 mg via ORAL
  Filled 2016-06-07: qty 1

## 2016-06-07 MED ORDER — DIPHENHYDRAMINE HCL 50 MG/ML IJ SOLN: 12.5000 mg | INTRAMUSCULAR | Status: DC | PRN

## 2016-06-07 MED ORDER — COCONUT OIL OIL: 1.0000 "application " | TOPICAL_OIL | Status: DC | PRN

## 2016-06-07 MED ORDER — ZOLPIDEM TARTRATE 5 MG PO TABS: 5.0000 mg | ORAL_TABLET | Freq: Every evening | ORAL | Status: DC | PRN

## 2016-06-07 MED ORDER — LIDOCAINE HCL (PF) 1 % IJ SOLN
INTRAMUSCULAR | Status: AC
  Filled 2016-06-07: qty 30

## 2016-06-07 MED ORDER — ONDANSETRON HCL 4 MG PO TABS: 4.0000 mg | ORAL_TABLET | ORAL | Status: DC | PRN

## 2016-06-07 MED ORDER — DIPHENHYDRAMINE HCL 25 MG PO CAPS: 25.0000 mg | ORAL_CAPSULE | Freq: Four times a day (QID) | ORAL | Status: DC | PRN

## 2016-06-07 MED ORDER — SIMETHICONE 80 MG PO CHEW: 80.0000 mg | CHEWABLE_TABLET | ORAL | Status: DC | PRN

## 2016-06-07 MED ORDER — OXYTOCIN 40 UNITS IN LACTATED RINGERS INFUSION - SIMPLE MED
INTRAVENOUS | Status: AC
  Filled 2016-06-07: qty 1000

## 2016-06-07 MED ORDER — WITCH HAZEL-GLYCERIN EX PADS: 1.0000 "application " | MEDICATED_PAD | CUTANEOUS | Status: DC | PRN

## 2016-06-07 MED ORDER — OXYTOCIN 10 UNIT/ML IJ SOLN
INTRAMUSCULAR | Status: AC
  Filled 2016-06-07: qty 2

## 2016-06-07 MED ORDER — IBUPROFEN 600 MG PO TABS
600.0000 mg | ORAL_TABLET | Freq: Four times a day (QID) | ORAL | Status: DC
  Administered 2016-06-07 (×2): 600 mg via ORAL
  Filled 2016-06-07 (×3): qty 1

## 2016-06-07 MED ORDER — SENNOSIDES-DOCUSATE SODIUM 8.6-50 MG PO TABS
2.0000 | ORAL_TABLET | ORAL | Status: DC
  Filled 2016-06-07: qty 2

## 2016-06-07 MED ORDER — LIDOCAINE HCL (PF) 1 % IJ SOLN
INTRAMUSCULAR | Status: DC | PRN
  Administered 2016-06-07: 3 mL

## 2016-06-07 MED ORDER — FENTANYL 2.5 MCG/ML W/ROPIVACAINE 0.2% IN NS 100 ML EPIDURAL INFUSION (ARMC-ANES): 10.0000 mL/h | EPIDURAL | Status: DC

## 2016-06-07 MED ORDER — DIBUCAINE 1 % RE OINT: 1.0000 "application " | TOPICAL_OINTMENT | RECTAL | Status: DC | PRN

## 2016-06-07 NOTE — Anesthesia Procedure Notes (Signed)
Epidural Patient location during procedure: OB Start time: 06/07/2016 1:22 AM End time: 06/07/2016 1:36 AM  Staffing Anesthesiologist: Alvin Critchley Performed: anesthesiologist   Preanesthetic Checklist Completed: patient identified, site marked, surgical consent, pre-op evaluation, timeout performed, IV checked, risks and benefits discussed and monitors and equipment checked  Epidural Patient position: sitting Prep: Betadine and site prepped and draped Patient monitoring: heart rate, cardiac monitor, continuous pulse ox and blood pressure Approach: midline Location: L3-L4 Injection technique: LOR air  Needle:  Needle type: Tuohy  Needle gauge: 18 G Needle length: 9 cm Catheter type: closed end Catheter size: 20 Guage Test dose: negative and 1.5% lidocaine with Epi 1:200 K  Assessment Sensory level: T8  Additional Notes Time out called.  Patient placed in sitting position.  Back prepped and draped in sterile fashion.  A skin wheal was made in the L3-L4 interspace with 1% Lidocaine plain.  An 54 G Tuohy needle was guided into the epidural space by a loss of resistance technique.  The epidural catheter was threaded easily and the TD was negative.  No blood or paresthesias and the patient tolerated the procedure well.  The catheter was affixed to the back in sterile fashion.Reason for block:procedure for pain

## 2016-06-07 NOTE — Anesthesia Preprocedure Evaluation (Addendum)
Anesthesia Evaluation  Patient identified by MRN, date of birth, ID band Patient awake    Reviewed: Allergy & Precautions, NPO status , Patient's Chart, lab work & pertinent test results  Airway Mallampati: II  TM Distance: >3 FB     Dental no notable dental hx.    Pulmonary former smoker,    Pulmonary exam normal        Cardiovascular negative cardio ROS Normal cardiovascular exam     Neuro/Psych negative neurological ROS  negative psych ROS   GI/Hepatic negative GI ROS, Neg liver ROS,   Endo/Other  negative endocrine ROS  Renal/GU negative Renal ROS  negative genitourinary   Musculoskeletal   Abdominal Normal abdominal exam  (+)   Peds negative pediatric ROS (+)  Hematology negative hematology ROS (+)   Anesthesia Other Findings   Reproductive/Obstetrics (+) Pregnancy                            Anesthesia Physical Anesthesia Plan  ASA: II  Anesthesia Plan: Epidural   Post-op Pain Management:    Induction:   Airway Management Planned: Natural Airway  Additional Equipment:   Intra-op Plan:   Post-operative Plan:   Informed Consent: I have reviewed the patients History and Physical, chart, labs and discussed the procedure including the risks, benefits and alternatives for the proposed anesthesia with the patient or authorized representative who has indicated his/her understanding and acceptance.   Dental advisory given  Plan Discussed with: CRNA and Surgeon  Anesthesia Plan Comments:         Anesthesia Quick Evaluation

## 2016-06-07 NOTE — Plan of Care (Signed)
Pt up to void. Voided QS. Pericare performed well per pt. Pt ready for transfer to 341 via wheelchair in stable condition. Lenore Cordia RNC

## 2016-06-08 LAB — CBC
HEMATOCRIT: 31.6 % — AB (ref 35.0–47.0)
HEMOGLOBIN: 10.7 g/dL — AB (ref 12.0–16.0)
MCH: 30.7 pg (ref 26.0–34.0)
MCHC: 33.9 g/dL (ref 32.0–36.0)
MCV: 90.4 fL (ref 80.0–100.0)
Platelets: 169 10*3/uL (ref 150–440)
RBC: 3.5 MIL/uL — AB (ref 3.80–5.20)
RDW: 14 % (ref 11.5–14.5)
WBC: 11.8 10*3/uL — ABNORMAL HIGH (ref 3.6–11.0)

## 2016-06-08 LAB — RPR: RPR Ser Ql: NONREACTIVE

## 2016-06-08 MED ORDER — ACETAMINOPHEN 325 MG PO TABS: 650.0000 mg | ORAL_TABLET | ORAL | Status: DC | PRN

## 2016-06-08 MED ORDER — IBUPROFEN 600 MG PO TABS: 600.0000 mg | ORAL_TABLET | Freq: Four times a day (QID) | ORAL | 0 refills | Status: DC

## 2016-06-08 NOTE — Progress Notes (Signed)
Pt discharged home with infant.  Discharge instructions and follow up appointment given to and reviewed with pt.  Pt verbalized understanding.  Escorted by auxillary. 

## 2016-06-08 NOTE — Discharge Instructions (Signed)

## 2016-06-08 NOTE — Progress Notes (Signed)
Post Partum Day 1 Subjective: Doing well, no complaints.  Tolerating regular diet, pain with PO meds, voiding and ambulating without difficulty.  No CP SOB F/C N/V or leg pain   Objective: BP (!) 109/53 (BP Location: Right Arm)   Pulse 62   Temp 98.5 F (36.9 C) (Oral)   Resp 18   Ht 5\' 5"  (1.651 m)   Wt 90.7 kg (200 lb)   LMP 08/19/2015 (Approximate)   SpO2 100%   Breastfeeding? Unknown   BMI 33.28 kg/m   Physical Exam:  General: NAD CV: RRR Pulm: nl effort, CTABL Lochia: moderate Uterine Fundus: fundus firm and below umbilicus DVT Evaluation: no cords, ttp LEs    Recent Labs  06/06/16 2330 06/08/16 0545  HGB 11.6* 10.7*  HCT 32.8* 31.6*  WBC 9.6 11.8*  PLT 177 169    Assessment/Plan: 25 y.o. G2P2002 postpartum day # 1  1. Post partum cares:  Continue routine 2. Continue inpatient status     ----- Larey Days, MD Attending Obstetrician and Gynecologist [facility]

## 2016-06-08 NOTE — Anesthesia Postprocedure Evaluation (Signed)
Anesthesia Post Note  Patient: Andrea Rivera  Procedure(s) Performed: * No procedures listed *  Patient location during evaluation: Mother Baby Anesthesia Type: Epidural Level of consciousness: awake and alert and oriented Pain management: satisfactory to patient Vital Signs Assessment: post-procedure vital signs reviewed and stable Respiratory status: respiratory function stable Cardiovascular status: blood pressure returned to baseline Postop Assessment: no headache, no backache, epidural receding, adequate PO intake, no signs of nausea or vomiting and patient able to bend at knees Anesthetic complications: no    Last Vitals:  Vitals:   06/07/16 1905 06/07/16 2316  BP: 125/83 (!) 109/53  Pulse: 78 62  Resp:  18  Temp: 36.9 C 36.9 C    Last Pain:  Vitals:   06/07/16 2316  TempSrc: Oral  PainSc:                  Blima Singer

## 2016-06-08 NOTE — Discharge Summary (Signed)
Obstetrical Discharge Summary  Patient Name: Andrea Rivera DOB: 02-13-1991 MRN: [id number]  Date of Admission: 06/06/2016 Date of Discharge: 06/08/2016  Primary OB: [facility]  Gestational Age at Delivery: [redacted]w[redacted]d   Antepartum complications:  None noted Admitting Diagnosis: spontaneous labor Secondary Diagnosis: Patient Active Problem List   Diagnosis Date Noted  . Labor and delivery indication for care or intervention 06/08/2016  . Uterine contractions during pregnancy 06/06/2016    Augmentation: none Complications: None Intrapartum complications/course:  Date of Delivery: 06/07/16 Delivered By: Huel Cote Delivery Type: spontaneous vaginal delivery Anesthesia: epidural Placenta: sponatneous Laceration: 1st degree Episiotomy: none Newborn Data: Live born female  Birth Weight: 7 lb 14 oz (3572 g) APGAR: 8, 9    Discharge Physical Exam:  BP 117/67 (BP Location: Left Arm)   Pulse 76   Temp 98.6 F (37 C) (Oral)   Resp 18   Ht 5\' 5"  (1.651 m)   Wt 90.7 kg (200 lb)   LMP 08/19/2015 (Approximate)   SpO2 100%   Breastfeeding? Unknown   BMI 33.28 kg/m   General: NAD CV: RRR Pulm: CTABL, nl effort ABD: s/nd/nt, fundus firm and below the umbilicus Lochia: moderate DVT Evaluation: LE non-ttp, no evidence of DVT on exam.  Hemoglobin  Date Value Ref Range Status  06/08/2016 10.7 (L) 12.0 - 16.0 g/dL Final   HGB  Date Value Ref Range Status  09/19/2012 13.7 12.0 - 16.0 g/dL Final   HCT  Date Value Ref Range Status  06/08/2016 31.6 (L) 35.0 - 47.0 % Final  09/19/2012 38.9 35.0 - 47.0 % Final    Post partum course: routine Postpartum Procedures: none Disposition: stable, discharge to home. Baby Feeding: breastmilk Baby Disposition: home with mom   Contraception: TBD   Prenatal Labs:  Prenatal labs: ABO, Rh:  b+ Antibody:  neg Rubella:  imm RPR:   neg HBsAg:    HIV:   neg GBS:   +   Plan:  Andrea Rivera was discharged to home in good  condition. Follow-up appointment at [facility] with Dr. Ouida Sills in 6 weeks   Discharge Medications:   Medication List    TAKE these medications   acetaminophen 325 MG tablet Commonly known as:  TYLENOL Take 2 tablets (650 mg total) by mouth every 4 (four) hours as needed (for pain scale < 4).   ibuprofen 600 MG tablet Commonly known as:  ADVIL,MOTRIN Take 1 tablet (600 mg total) by mouth every 6 (six) hours.   PROVIDA DHA 16-16-1.25-110 MG Caps Take 1 capsule by mouth daily.         Signed: ----- Larey Days, MD Attending Obstetrician and Gynecologist [facility], Department of [facility]

## 2018-08-03 NOTE — Progress Notes (Signed)
Andrea Rivera presents for [place] nurse interview visit. Pregnancy confirmation done  _ACHD _____.  G-3 .  P- 2 0 0 2   . Pregnancy education material explained and given. _0__ cats in the home. NOB labs ordered. (TSH/HbgA1c due to Increased BMI,sickle cell). HIV labs and Drug screen were explained optional and she did not decline. Drug screen ordered. PNV encouraged. Genetic screening options discussed. Genetic testing: Ordered.  Pt may discuss with provider. Pt. To follow up with provider in _2_ weeks for NOB physical. Also schedule for dating and viability ultrasound in 1 week.  All questions answered.

## 2018-08-05 ENCOUNTER — Ambulatory Visit (INDEPENDENT_AMBULATORY_CARE_PROVIDER_SITE_OTHER): Payer: Medicaid Other | Admitting: Obstetrics and Gynecology

## 2018-08-05 VITALS — BP 112/69 | HR 76 | Ht 65.0 in | Wt 183.4 lb

## 2018-08-05 DIAGNOSIS — Z3687 Encounter for antenatal screening for uncertain dates: Secondary | ICD-10-CM

## 2018-08-05 DIAGNOSIS — Z3481 Encounter for supervision of other normal pregnancy, first trimester: Secondary | ICD-10-CM

## 2018-08-05 DIAGNOSIS — Z202 Contact with and (suspected) exposure to infections with a predominantly sexual mode of transmission: Secondary | ICD-10-CM

## 2018-08-05 DIAGNOSIS — E669 Obesity, unspecified: Secondary | ICD-10-CM

## 2018-08-06 LAB — HIV ANTIBODY (ROUTINE TESTING W REFLEX): HIV SCREEN 4TH GENERATION: NONREACTIVE

## 2018-08-06 LAB — ANTIBODY SCREEN: Antibody Screen: NEGATIVE

## 2018-08-06 LAB — TSH: TSH: 0.296 u[IU]/mL — AB (ref 0.450–4.500)

## 2018-08-06 LAB — ABO AND RH: Rh Factor: POSITIVE

## 2018-08-06 LAB — HGB SOLU + RFLX FRAC: Sickle Solubility Test - HGBRFX: NEGATIVE

## 2018-08-06 LAB — HEMOGLOBIN A1C
Est. average glucose Bld gHb Est-mCnc: 103 mg/dL
Hgb A1c MFr Bld: 5.2 % (ref 4.8–5.6)

## 2018-08-06 LAB — HEPATITIS B SURFACE ANTIGEN: Hepatitis B Surface Ag: NEGATIVE

## 2018-08-06 LAB — RUBELLA SCREEN: RUBELLA: 2.28 {index} (ref >0.99)

## 2018-08-06 LAB — RPR: RPR Ser Ql: NONREACTIVE

## 2018-08-06 LAB — VARICELLA ZOSTER ANTIBODY, IGG

## 2018-08-14 ENCOUNTER — Encounter: Payer: Self-pay | Admitting: Obstetrics and Gynecology

## 2018-08-31 NOTE — L&D Delivery Note (Signed)
Delivery Summary for Andrea Rivera  Labor Events:   Preterm labor:   Rupture date:   Rupture time:   Rupture type: Bulging bag of water  Fluid Color:   Induction:   Augmentation:   Complications:   Cervical ripening:          Delivery:   Episiotomy:   Lacerations:   Repair suture:   Repair # of packets:   Blood loss (ml): 300   Information for the patient's newborn:  Cendy, Oconnor [[id number]]    Delivery 02/15/2019 11:52 PM by  Vaginal, Spontaneous Sex:  female Gestational Age: [redacted]w[redacted]d Delivery Clinician:   Living?:         APGARS  One minute Five minutes Ten minutes  Skin color:        Heart rate:        Grimace:        Muscle tone:        Breathing:        Totals: 4  8      Presentation/position:      Resuscitation:   Cord information:    Disposition of cord blood:     Blood gases sent?  Complications:   Placenta: Delivered:       appearance Newborn Measurements: Weight: 4 lb 14.3 oz (2220 g)  Height: 18.11"  Head circumference:    Chest circumference:    Other providers:    Additional  information: Forceps:   Vacuum:   Breech:   Observed anomalies        Delivery Note At 11:53 PM a viable and healthy child was precipitously delivered via Vaginal, Spontaneous (Presentation: Vertex) by nurse.  APGAR: 4, 8; weight 2220 grams.  Patient was delivered after 7 minute fetal deceleration from baseline of 120s, with nadir at 50's for 2 minutes. I arrived within 1-2 minutes after delivery.  Placenta status: spontaneously removed, intact, immediately after birth of fetus.  Cord: 3-vessel with the following complications: None.  Cord pH: not obtained.  Delayed cord clamping not observed.   Anesthesia:  Epidural Episiotomy: None Lacerations: None.  Left labial abrasion, hemostatic Suture Repair: None Est. Blood Loss (mL): 300  Mom to postpartum.  Baby to Couplet care / Skin to Skin.  Rubie Maid, MD 02/16/2019, 12:09 AM

## 2018-09-01 ENCOUNTER — Encounter: Payer: Medicaid Other | Admitting: Obstetrics and Gynecology

## 2018-09-02 ENCOUNTER — Encounter: Payer: Medicaid Other | Admitting: Obstetrics and Gynecology

## 2018-09-07 ENCOUNTER — Encounter: Payer: Self-pay | Admitting: Obstetrics and Gynecology

## 2018-09-07 ENCOUNTER — Other Ambulatory Visit (HOSPITAL_COMMUNITY)
Admission: RE | Admit: 2018-09-07 | Discharge: 2018-09-07 | Disposition: A | Discharge status: Home / Self Care | Payer: Medicaid Other | Source: Ambulatory Visit | Attending: Obstetrics and Gynecology | Admitting: Obstetrics and Gynecology

## 2018-09-07 ENCOUNTER — Ambulatory Visit (INDEPENDENT_AMBULATORY_CARE_PROVIDER_SITE_OTHER): Payer: Medicaid Other | Admitting: Obstetrics and Gynecology

## 2018-09-07 ENCOUNTER — Other Ambulatory Visit (INDEPENDENT_AMBULATORY_CARE_PROVIDER_SITE_OTHER): Payer: Medicaid Other

## 2018-09-07 VITALS — BP 114/60 | HR 65 | Wt 182.2 lb

## 2018-09-07 DIAGNOSIS — Z3687 Encounter for antenatal screening for uncertain dates: Secondary | ICD-10-CM | POA: Diagnosis not present

## 2018-09-07 DIAGNOSIS — Z3A17 17 weeks gestation of pregnancy: Secondary | ICD-10-CM

## 2018-09-07 DIAGNOSIS — Z3482 Encounter for supervision of other normal pregnancy, second trimester: Secondary | ICD-10-CM | POA: Diagnosis not present

## 2018-09-07 DIAGNOSIS — Z124 Encounter for screening for malignant neoplasm of cervix: Secondary | ICD-10-CM | POA: Insufficient documentation

## 2018-09-07 LAB — POCT URINALYSIS DIPSTICK OB
Bilirubin, UA: NEGATIVE
Glucose, UA: NEGATIVE
Ketones, UA: NEGATIVE
Leukocytes, UA: NEGATIVE
Nitrite, UA: NEGATIVE
PH UA: 6.5 (ref 5.0–8.0)
PROTEIN: NEGATIVE
RBC UA: NEGATIVE
Spec Grav, UA: 1.01 (ref 1.010–1.025)
Urobilinogen, UA: 0.2 E.U./dL

## 2018-09-07 NOTE — Progress Notes (Signed)
Patient comes in for a new OB appointment. Patient is wanting to do genetic testing today. No [place] in the chart and none scheduled. Patient is due for PAP today.

## 2018-09-07 NOTE — Addendum Note (Signed)
Addended by: Durwin Glaze on: 09/07/2018 04:05 PM   Modules accepted: Orders

## 2018-09-07 NOTE — Progress Notes (Signed)
NOB: Patient here for new OB.  2 previous vaginal births.  Patient states they were uncomplicated.  She has no idea of her last menstrual.  Believes she is at least [redacted] weeks pregnant.  She is currently taking prenatal vitamins.   Desires genetic testing today.  Ultrasound today for dating.  Pap GC/CT performed.  Physical examination General NAD, Conversant  HEENT Atraumatic; Op clear with mmm.  Normo-cephalic. Pupils reactive. Anicteric sclerae  Thyroid/Neck Smooth without nodularity or enlargement. Normal ROM.  Neck Supple.  Skin No rashes, lesions or ulceration. Normal palpated skin turgor. No nodularity.  Breasts: No masses or discharge.  Symmetric.  No axillary adenopathy.  Lungs: Clear to auscultation.No rales or wheezes. Normal Respiratory effort, no retractions.  Heart: NSR.  No murmurs or rubs appreciated. No periferal edema  Abdomen: Soft.  Non-tender.  No masses.  No HSM. No hernia  Extremities: Moves all appropriately.  Normal ROM for age. No lymphadenopathy.  Neuro: Oriented to PPT.  Normal mood. Normal affect.     Pelvic:   Vulva: Normal appearance.  No lesions.  Vagina: No lesions or abnormalities noted.  Support: Normal pelvic support.  Urethra No masses tenderness or scarring.  Meatus Normal size without lesions or prolapse.  Cervix: Normal appearance.  No lesions.  Anus: Normal exam.  No lesions.  Perineum: Normal exam.  No lesions.        Bimanual   Adnexae: No masses.  Non-tender to palpation.  Uterus: Enlarged. 17 wks Pos FHT's  Non-tender.  Mobile.  AV.  Adnexae: No masses.  Non-tender to palpation.  Cul-de-sac: Negative for abnormality.  Adnexae: No masses.  Non-tender to palpation.         Pelvimetry   Diagonal: Reached.  Spines: Average.  Sacrum: Concave.  Pubic Arch: Normal.

## 2018-09-08 LAB — URINALYSIS, ROUTINE W REFLEX MICROSCOPIC
BILIRUBIN UA: NEGATIVE
Glucose, UA: NEGATIVE
Ketones, UA: NEGATIVE
Nitrite, UA: NEGATIVE
PROTEIN UA: NEGATIVE
RBC, UA: NEGATIVE
SPEC GRAV UA: 1.02 (ref 1.005–1.030)
Urobilinogen, Ur: 0.2 mg/dL (ref 0.2–1.0)
pH, UA: 7.5 (ref 5.0–7.5)

## 2018-09-08 LAB — MONITOR DRUG PROFILE 14(MW)
AMPHETAMINE SCREEN URINE: NEGATIVE ng/mL
BARBITURATE SCREEN URINE: NEGATIVE ng/mL
BENZODIAZEPINE SCREEN, URINE: NEGATIVE ng/mL
Buprenorphine, Urine: NEGATIVE ng/mL
CANNABINOIDS UR QL SCN: NEGATIVE ng/mL
COCAINE(METAB.)SCREEN, URINE: NEGATIVE ng/mL
CREATININE(CRT), U: 137.7 mg/dL (ref 20.0–300.0)
Fentanyl, Urine: NEGATIVE pg/mL
MEPERIDINE SCREEN, URINE: NEGATIVE ng/mL
Methadone Screen, Urine: NEGATIVE ng/mL
OXYCODONE+OXYMORPHONE UR QL SCN: NEGATIVE ng/mL
Opiate Scrn, Ur: NEGATIVE ng/mL
PROPOXYPHENE SCREEN URINE: NEGATIVE ng/mL
Ph of Urine: 7.6 (ref 4.5–8.9)
Phencyclidine Qn, Ur: NEGATIVE ng/mL
SPECIFIC GRAVITY: 1.022
Tramadol Screen, Urine: NEGATIVE ng/mL

## 2018-09-08 LAB — MICROSCOPIC EXAMINATION: CASTS: NONE SEEN /LPF

## 2018-09-08 LAB — NICOTINE SCREEN, URINE: Cotinine Ql Scrn, Ur: NEGATIVE ng/mL

## 2018-09-08 LAB — GC/CHLAMYDIA PROBE AMP
CHLAMYDIA, DNA PROBE: NEGATIVE
NEISSERIA GONORRHOEAE BY PCR: NEGATIVE

## 2018-09-09 LAB — URINE CULTURE

## 2018-09-09 LAB — CYTOLOGY - PAP: Diagnosis: NEGATIVE

## 2018-09-12 LAB — MATERNIT21  PLUS CORE+ESS+SCA, BLOOD
CHROMOSOME 13: NEGATIVE
CHROMOSOME 18: NEGATIVE
CHROMOSOME 21: NEGATIVE
Fetal Fraction: 9
Y Chromosome: DETECTED

## 2018-10-05 ENCOUNTER — Encounter: Payer: Medicaid Other | Admitting: Obstetrics and Gynecology

## 2018-10-05 ENCOUNTER — Ambulatory Visit (INDEPENDENT_AMBULATORY_CARE_PROVIDER_SITE_OTHER): Payer: Medicaid Other | Admitting: Obstetrics and Gynecology

## 2018-10-05 VITALS — BP 102/66 | HR 78 | Wt 181.8 lb

## 2018-10-05 DIAGNOSIS — F439 Reaction to severe stress, unspecified: Secondary | ICD-10-CM

## 2018-10-05 DIAGNOSIS — O2612 Low weight gain in pregnancy, second trimester: Secondary | ICD-10-CM

## 2018-10-05 DIAGNOSIS — Z3482 Encounter for supervision of other normal pregnancy, second trimester: Secondary | ICD-10-CM

## 2018-10-05 NOTE — Progress Notes (Signed)
ROB-Pt stated that she concerned about not gaining weight.

## 2018-10-05 NOTE — Progress Notes (Signed)
ROB: Patient notes lots of social issues (issues with FOBs of current and previous children). Also concerned about poor weight gain due to stress. Offered counseling.  Also discussed contraception options at patient's request.  Given handout. Discussed ways to help weight gain. RTC in 2 weeks for anatomy scan, 4 weeks for OB visit. Declines flu vaccine.

## 2018-10-19 ENCOUNTER — Ambulatory Visit (INDEPENDENT_AMBULATORY_CARE_PROVIDER_SITE_OTHER): Payer: Medicaid Other

## 2018-10-19 DIAGNOSIS — Z3482 Encounter for supervision of other normal pregnancy, second trimester: Secondary | ICD-10-CM | POA: Diagnosis not present

## 2018-11-02 ENCOUNTER — Ambulatory Visit (INDEPENDENT_AMBULATORY_CARE_PROVIDER_SITE_OTHER): Payer: Medicaid Other | Admitting: Obstetrics and Gynecology

## 2018-11-02 ENCOUNTER — Encounter: Payer: Self-pay | Admitting: Obstetrics and Gynecology

## 2018-11-02 VITALS — BP 97/64 | HR 78 | Ht 65.0 in | Wt 188.7 lb

## 2018-11-02 DIAGNOSIS — Z3482 Encounter for supervision of other normal pregnancy, second trimester: Secondary | ICD-10-CM

## 2018-11-02 LAB — POCT URINALYSIS DIPSTICK OB
Bilirubin, UA: NEGATIVE
Blood, UA: NEGATIVE
GLUCOSE, UA: NEGATIVE
Ketones, UA: NEGATIVE
LEUKOCYTES UA: NEGATIVE
NITRITE UA: NEGATIVE
POC,PROTEIN,UA: NEGATIVE
Spec Grav, UA: 1.01 (ref 1.010–1.025)
Urobilinogen, UA: 0.2 E.U./dL
pH, UA: 7 (ref 5.0–8.0)

## 2018-11-02 NOTE — Progress Notes (Signed)
ROB: No complaints.  Patient feeling occasional fetal movement.  Ultrasound findings reviewed with the patient.

## 2018-11-02 NOTE — Progress Notes (Signed)
Patient comes in today for ROB visit. No concerns today. 

## 2018-11-22 ENCOUNTER — Telehealth: Payer: Self-pay

## 2018-11-22 NOTE — Telephone Encounter (Signed)
Pt called made aware of COVID-19 limiting pts in the office. Pt made an appointment for her 1 GT and was given instructions for the test.

## 2018-11-29 ENCOUNTER — Telehealth: Payer: Self-pay

## 2018-11-29 NOTE — Telephone Encounter (Signed)
Pt called no answer LM via voicemail to call the office to go over the COVID-19 questions before her visit to the office tomorrow.

## 2018-11-29 NOTE — Patient Instructions (Addendum)
FREQUENTLY ASKED QUESTIONS FOR OBSTETRICS/PEDIATRICS    Q: Why are visitor restrictions different for maternity care areas?  [place] is restricting visitors for the duration of the patient's hospitalization. The birth of a child involves the mother, considered the patient, and a birthing partner. These are unprecedented times and we are making the exception to allow a birthing partner to be a part of the patient unit. No other guests will be allowed in our [place] at [facility] and at [facility].   Q: Are credentialed doulas allowed to support their existing patients?  We acknowledge the value these doula partnerships offer our care teams and many birthing families in our communities. Each laboring mother is allowed one birthing partner of the patient's choosing for her entire hospitalization.   Q: Are visitor restrictions different for hospitalized children?  Pediatric patients (infants and children under 42 years of age), such as those in the Children's Unit, Pediatric ICU and NICU, will be allowed two visitors (parents or legal guardians)   Q: Are pregnant women at an increased risk for COVID-19?  The [employer] of Obstetricians and Gynecologists (ACOG) is monitoring closely the coronavirus pandemic. With the limited information available, data does not indicate pregnant women are at an increased risk. However, pregnant women are known to be at greater risk for respiratory infections like flu. With that in mind, expectant mothers are considered an at-risk population for COVID-19, according to ACOG.   Q: Are newborns at an increased risk for COVID-19?  A limited sample of COVID-19 data with newborns indicates the virus is not transferred to the infant during pregnancy. However, postpartum separation is recommended by the Centers for Disease Control (CDC). As a result [place] recommends and strongly encourages  temporary separation of moms and babies who test positive for COVID-19 or are awaiting results to rule out COVID-19 based on CDC guidelines.   Q: If you have a suspected case of COVID-19, is the NICU couplet care room an option?  No. If either patient is considered at-risk for having COVID-19, the [place] at [facility] will not use the NICU couplet care rooms for that family.   Q: [place] is urging that elective procedures be postponed. What is considered elective for women's and children's service line?  NOT ELECTIVE: Obstetric procedures, even those with an element of choice on timing, are not considered elective. Circumcisions are considered elective procedures, however, these do not deplete blood products and other resources, which is the spirit in which the COVID-19 postponement of elective procedures was intended. Therefore, circumcisions will be allowed.   ELECTIVE: Postpartum tubal ligations are considered elective and should be postponed. Q&A for Obstetricians, Gynecologists and Pediatricians  Published November 18, 2018   [facility] supports as much as possible the medical care team working with the patient's individual needs to address timing during these unprecedented times. We seek the support of our medical care team in preserving needed resources throughout our crisis response to COVID-19.   Q: How does COVID-19 impact breastfeeding?  Breastmilk is safe for your baby - even if the mother has tested positive for COVID-19. If a COVID-19+ mother decides to breastfeed while inpatient and after discharge, we suggest proper protective equipment be worn and hand hygiene be performed  before and after feeding the infant. The new mother also has the option to pump her milk and have a healthy family member feed the baby to protect the baby from getting the virus.   Q: Should we urge patients to avoid baby showers and large gatherings?  Yes. As has been recommended  for all citizens in our communities, gatherings of 10 or more should be avoided - pregnant or not. Seek creative options for "hosting" baby showers through electronic means that honor the request for social distancing during this time of heightened awareness.   Q: Should patients miss their prenatal appointments?  No. Prenatal visits are NOT elective. While we want to limit contact and exposure, prenatal care is vital right now. Contact your physician's office if you have concerns about your visits. We are limiting outpatient office visits to the patient and one guest in order to reduce the potential for exposure.   Q: What if a pregnant woman feels sick? Should she miss her prenatal visit then?  A pregnant woman experiencing coronavirus-like symptoms (i.e., cough, fever, difficulty breathing, shortness of breath, gastrointestinal issues) should contact her pregnancy care provider by phone. Her medical professional can best determine whether she should use a video visit or possibly go to a collection site to be tested for COVID-19. Contacting her primary care provider or her pregnancy care provider is her first step.   Q: What can I do about childbirth education? All the classes are cancelled.  The [facility] will offer online learning to support mothers on their journey. We currently offer Understanding Childbirth, Understanding Breastfeeding and Understanding Newborn Care as an online class. Please visit our website, [email or web address], to register for an online class.   Q: How can I keep from getting COVID-19? Q&A for Obstetricians, Gynecologists and Pediatricians  Published November 18, 2018   Together, we can reduce the risk of exposure to the virus and help you and your family remain healthy and safe. One of the best ways to protect yourself is to wash your hands frequently using soap and water. Also, you should avoid touching your eyes, nose and mouth with unwashed  hands, avoid physical contact with others and practice social distancing.   Q: How are employees being informed about what to do?  [place] leaders receive a daily COVID-19 update and share relevant information with their teams. This is a time when health care professionals are called on to lead within our community. We appreciate our staff's engagement with our COVID-19 updates and encourage them to share best practices on reducing the spread of the virus with our patients and community. We are prepared to provide the exceptional COVID-19 care and coordination our community needs, expects and deserves.   Q: Who's in charge of this issue at [facility]?  The leadership structure and process established to address COVID-19 includes Chief Physician Executive Phoebe Sharps, MD; Infection Prevention Medical Director Carlyle Basques, MD; and Infection Prevention Interim Director Hubert Azure, MSN, RN, CIC, CSPDT. A team of [place] experts reflecting a broad spectrum of our workforce is meeting daily to evaluate new information we receive about [id number] and to adapt policies and practices accordingly.                         Published November 18, 2018   Obstetric patients have recently been named to the list of individuals who are considered a "high risk" group.  This is likely due  to the decreased immune function that pregnant women have and thus are more susceptible to infection.The [employer] of Obstetricians and Gynecologists (ACOG) is monitoring closely the coronavirus pandemic. With the limited information available, data does not indicate pregnant women are at an increased risk. However, pregnant women are known to be at greater risk for respiratory infections like flu. With that in mind, expectant mothers are considered an at-risk population for COVID-19, according to ACOG.  The typical precautions that are being advised apply to you.   Social distancing-stay home as much as possible and limit  contact with others. Especially avoid groups of people.   Wash your hands often with soap and water.  If soap and water are not readily available using alcohol based hand sanitizer. If coughing and sneezing, cover your mouth and nose when coughing or sneezing. Avoid shaking hands with others.  Avoid touching your eyes nose or mouth with unwashed hands. If you develop upper respiratory type symptoms especially with fever seek medical attention.  If you have some symptoms, but not all continue to monitor at home and if symptoms worsen then seek medical attention.  Please remember, COVID-19 is not thought to be transmissible across the placenta-so that while your baby is in utero he/she will not get infected even if you have the virus.   Tdap Vaccine (Tetanus, Diphtheria and Pertussis): What You Need to Know 1. Why get vaccinated? Tetanus, diphtheria and pertussis are very serious diseases. Tdap vaccine can protect [place] from these diseases. And, Tdap vaccine given to pregnant women can protect newborn babies against pertussis.Marland Kitchen TETANUS (Lockjaw) is rare in the [place] today. It causes painful muscle tightening and stiffness, usually all over the body.  It can lead to tightening of muscles in the head and neck so you can't open your mouth, swallow, or sometimes even breathe. Tetanus kills about 1 out of 10 people who are infected even after receiving the best medical care. DIPHTHERIA is also rare in the [place] today. It can cause a thick coating to form in the back of the throat.  It can lead to breathing problems, heart failure, paralysis, and death. PERTUSSIS (Whooping Cough) causes severe coughing spells, which can cause difficulty breathing, vomiting and disturbed sleep.  It can also lead to weight loss, incontinence, and rib fractures. Up to 2 in 100 adolescents and 5 in 100 adults with pertussis are hospitalized or have complications, which could include pneumonia or death. These  diseases are caused by bacteria. Diphtheria and pertussis are spread from person to person through secretions from coughing or sneezing. Tetanus enters the body through cuts, scratches, or wounds. Before vaccines, as many as 200,000 cases of diphtheria, 200,000 cases of pertussis, and hundreds of cases of tetanus, were reported in the [place] each year. Since vaccination began, reports of cases for tetanus and diphtheria have dropped by about 99% and for pertussis by about 80%. 2. Tdap vaccine Tdap vaccine can protect adolescents and adults from tetanus, diphtheria, and pertussis. One dose of Tdap is routinely given at age 58 or 44. People who did not get Tdap at that age should get it as soon as possible. Tdap is especially important for healthcare professionals and anyone having close contact with a baby younger than 12 months. Pregnant women should get a dose of Tdap during every pregnancy, to protect the newborn from pertussis. Infants are most at risk for severe, life-threatening complications from pertussis. Another vaccine, called Td, protects against tetanus and diphtheria,  but not pertussis. A Td booster should be given every 10 years. Tdap may be given as one of these boosters if you have never gotten Tdap before. Tdap may also be given after a severe cut or burn to prevent tetanus infection. Your doctor or the person giving you the vaccine can give you more information. Tdap may safely be given at the same time as other vaccines. 3. Some people should not get this vaccine  A person who has ever had a life-threatening allergic reaction after a previous dose of any diphtheria, tetanus or pertussis containing vaccine, OR has a severe allergy to any part of this vaccine, should not get Tdap vaccine. Tell the person giving the vaccine about any severe allergies.  Anyone who had coma or long repeated seizures within 7 days after a childhood dose of DTP or DTaP, or a previous dose of Tdap,  should not get Tdap, unless a cause other than the vaccine was found. They can still get Td.  Talk to your doctor if you: ? have seizures or another nervous system problem, ? had severe pain or swelling after any vaccine containing diphtheria, tetanus or pertussis, ? ever had a condition called Guillain-Barr Syndrome (GBS), ? aren't feeling well on the day the shot is scheduled. 4. Risks With any medicine, including vaccines, there is a chance of side effects. These are usually mild and go away on their own. Serious reactions are also possible but are rare. Most people who get Tdap vaccine do not have any problems with it. Mild problems following Tdap (Did not interfere with activities)  Pain where the shot was given (about 3 in 4 adolescents or 2 in 3 adults)  Redness or swelling where the shot was given (about 1 person in 5)  Mild fever of at least 100.38F (up to about 1 in 25 adolescents or 1 in 100 adults)  Headache (about 3 or 4 people in 10)  Tiredness (about 1 person in 3 or 4)  Nausea, vomiting, diarrhea, stomach ache (up to 1 in 4 adolescents or 1 in 10 adults)  Chills, sore joints (about 1 person in 10)  Body aches (about 1 person in 3 or 4)  Rash, swollen glands (uncommon) Moderate problems following Tdap (Interfered with activities, but did not require medical attention)  Pain where the shot was given (up to 1 in 5 or 6)  Redness or swelling where the shot was given (up to about 1 in 16 adolescents or 1 in 12 adults)  Fever over 102F (about 1 in 100 adolescents or 1 in 250 adults)  Headache (about 1 in 7 adolescents or 1 in 10 adults)  Nausea, vomiting, diarrhea, stomach ache (up to 1 or 3 people in 100)  Swelling of the entire arm where the shot was given (up to about 1 in 500). Severe problems following Tdap (Unable to perform usual activities; required medical attention)  Swelling, severe pain, bleeding and redness in the arm where the shot was given  (rare). Problems that could happen after any vaccine:  People sometimes faint after a medical procedure, including vaccination. Sitting or lying down for about 15 minutes can help prevent fainting, and injuries caused by a fall. Tell your doctor if you feel dizzy, or have vision changes or ringing in the ears.  Some people get severe pain in the shoulder and have difficulty moving the arm where a shot was given. This happens very rarely.  Any medication can cause a severe allergic  reaction. Such reactions from a vaccine are very rare, estimated at fewer than 1 in a million doses, and would happen within a few minutes to a few hours after the vaccination. As with any medicine, there is a very remote chance of a vaccine causing a serious injury or death. The safety of vaccines is always being monitored. For more information, visit: http://www.aguilar.org/ 5. What if there is a serious problem? What should I look for?  Look for anything that concerns you, such as signs of a severe allergic reaction, very high fever, or unusual behavior. Signs of a severe allergic reaction can include hives, swelling of the face and throat, difficulty breathing, a fast heartbeat, dizziness, and weakness. These would usually start a few minutes to a few hours after the vaccination. What should I do?  If you think it is a severe allergic reaction or other emergency that can't wait, call 9-1-1 or get the person to the nearest hospital. Otherwise, call your doctor.  Afterward, the reaction should be reported to the Vaccine Adverse Event Reporting System (VAERS). Your doctor might file this report, or you can do it yourself through the VAERS web site at www.vaers.SamedayNews.es, or by calling 302-624-4456. VAERS does not give medical advice. 6. The National Vaccine Injury Compensation Program The [employer] Vaccine Injury Compensation Program (VICP) is a federal program that was created to compensate people who may have  been injured by certain vaccines. Persons who believe they may have been injured by a vaccine can learn about the program and about filing a claim by calling 5105739669 or visiting the [place] website at GoldCloset.com.ee. There is a time limit to file a claim for compensation. 7. How can I learn more?  Ask your doctor. He or she can give you the vaccine package insert or suggest other sources of information.  Call your local or state health department.  Contact the Centers for Disease Control and Prevention (CDC): ? Call 541-412-4645 (1-800-CDC-INFO) or ? Visit CDC's website at http://hunter.com/ Vaccine Information Statement Tdap Vaccine (10/24/2013) This information is not intended to replace advice given to you by your health care provider. Make sure you discuss any questions you have with your health care provider. Document Released: 02/16/2012 Document Revised: 04/04/2018 Document Reviewed: 04/04/2018 Elsevier Interactive Patient Education  2019 [employer].

## 2018-11-29 NOTE — Telephone Encounter (Signed)
Pt called no answer LM via voicemail to call the office to go over prescreening questions before her visit tomorrow.

## 2018-11-30 ENCOUNTER — Ambulatory Visit (INDEPENDENT_AMBULATORY_CARE_PROVIDER_SITE_OTHER): Payer: Medicaid Other | Admitting: Obstetrics and Gynecology

## 2018-11-30 ENCOUNTER — Other Ambulatory Visit: Payer: Medicaid Other

## 2018-11-30 ENCOUNTER — Other Ambulatory Visit: Payer: Self-pay

## 2018-11-30 ENCOUNTER — Encounter: Payer: Self-pay | Admitting: Obstetrics and Gynecology

## 2018-11-30 VITALS — BP 104/59 | HR 86 | Wt 193.9 lb

## 2018-11-30 DIAGNOSIS — Z23 Encounter for immunization: Secondary | ICD-10-CM | POA: Diagnosis not present

## 2018-11-30 DIAGNOSIS — Z131 Encounter for screening for diabetes mellitus: Secondary | ICD-10-CM

## 2018-11-30 DIAGNOSIS — D2230 Melanocytic nevi of unspecified part of face: Secondary | ICD-10-CM

## 2018-11-30 DIAGNOSIS — Z3482 Encounter for supervision of other normal pregnancy, second trimester: Secondary | ICD-10-CM

## 2018-11-30 DIAGNOSIS — Z130 Encounter for screening for diseases of the blood and blood-forming organs and certain disorders involving the immune mechanism: Secondary | ICD-10-CM

## 2018-11-30 MED ORDER — VITAFOL GUMMIES 3.33-0.333-34.8 MG PO CHEW: 1.0000 | CHEWABLE_TABLET | Freq: Every day | ORAL | 11 refills | Status: DC

## 2018-11-30 MED ORDER — TETANUS-DIPHTH-ACELL PERTUSSIS 5-2.5-18.5 LF-MCG/0.5 IM SUSP
0.5000 mL | Freq: Once | INTRAMUSCULAR | Status: AC
  Administered 2018-11-30: 11:00:00 0.5 mL via INTRAMUSCULAR

## 2018-11-30 NOTE — Progress Notes (Signed)
ROB-pt present today for 28 week labs and prenatal care. Pt stated that she was doing well no problems.

## 2018-11-30 NOTE — Progress Notes (Signed)
ROB: Doing well, no complaints.  For 28 week labs today.  Desires to breastfeed,  desires unsure method for contraception but considering Depo Provera. For Tdap today, signed blood consent. RTC in 6 weeks.   The patient has Medicaid.  [facility] Medicaid Risk Screening Form completed today

## 2018-12-01 LAB — CBC
Hematocrit: 29.6 % — ABNORMAL LOW (ref 34.0–46.6)
Hemoglobin: 10.2 g/dL — ABNORMAL LOW (ref 11.1–15.9)
MCH: 30.8 pg (ref 26.6–33.0)
MCHC: 34.5 g/dL (ref 31.5–35.7)
MCV: 89 fL (ref 79–97)
Platelets: 208 10*3/uL (ref 150–450)
RBC: 3.31 x10E6/uL — ABNORMAL LOW (ref 3.77–5.28)
RDW: 12 % (ref 11.7–15.4)
WBC: 6.3 10*3/uL (ref 3.4–10.8)

## 2018-12-01 LAB — GLUCOSE, 1 HOUR GESTATIONAL: Gestational Diabetes Screen: 81 mg/dL (ref 65–139)

## 2018-12-01 LAB — RPR: RPR Ser Ql: NONREACTIVE

## 2018-12-02 ENCOUNTER — Other Ambulatory Visit: Payer: Self-pay

## 2018-12-02 MED ORDER — CENTRUM PO CHEW: 1.0000 | CHEWABLE_TABLET | Freq: Every day | ORAL | 11 refills | Status: DC

## 2018-12-28 ENCOUNTER — Telehealth: Payer: Self-pay | Admitting: Obstetrics and Gynecology

## 2018-12-28 NOTE — Telephone Encounter (Signed)
The patient called and stated that she has a mole above her eye "that is really bothering her" the patient described it as being extremely painful and causing discomfort. The patient would like to speak with a nurse and possibly get a referral to have it removed. Please advise.

## 2018-12-28 NOTE — Telephone Encounter (Signed)
Please let her know that we can refer her to a Dermatologist, however because she is pregnant, they may defer until after pregnancy.

## 2019-01-11 ENCOUNTER — Telehealth: Payer: Self-pay

## 2019-01-11 NOTE — Addendum Note (Signed)
Addended by: Edwyna Shell on: 01/11/2019 03:31 PM   Modules accepted: Orders

## 2019-01-11 NOTE — Telephone Encounter (Signed)
Pt prescreened no sx has mask.    Coronavirus (COVID-19) Are you at risk?  Are you at risk for the Coronavirus (COVID-19)?  To be considered HIGH RISK for Coronavirus (COVID-19), you have to meet the following criteria:  . Traveled to [place], [place], [place], [place] or [place]; or in the [place] to [place], [place], [place], or [place]; and have fever, cough, and shortness of breath within the last 2 weeks of travel OR . Been in close contact with a person diagnosed with COVID-19 within the last 2 weeks and have fever, cough, and shortness of breath . IF YOU DO NOT MEET THESE CRITERIA, YOU ARE CONSIDERED LOW RISK FOR COVID-19.  What to do if you are HIGH RISK for COVID-19?  Marland Kitchen If you are having a medical emergency, call 911. . Seek medical care right away. Before you go to a doctor's office, urgent care or emergency department, call ahead and tell them about your recent travel, contact with someone diagnosed with COVID-19, and your symptoms. You should receive instructions from your physician's office regarding next steps of care.  . When you arrive at healthcare provider, tell the healthcare staff immediately you have returned from visiting [place], [place], [place], [place] or [place]; or traveled in the [place] to [place], [place], [place], or [place]; in the last two weeks or you have been in close contact with a person diagnosed with COVID-19 in the last 2 weeks.   . Tell the health care staff about your symptoms: fever, cough and shortness of breath. . After you have been seen by a medical provider, you will be either: o Tested for (COVID-19) and discharged home on quarantine except to seek medical care if symptoms worsen, and asked to  - Stay home and avoid contact with others until you get your results (4-5 days)  - Avoid travel on public transportation if possible (such as bus, train, or airplane) or o Sent to the Emergency Department by EMS for evaluation,  COVID-19 testing, and possible admission depending on your condition and test results.  What to do if you are LOW RISK for COVID-19?  Reduce your risk of any infection by using the same precautions used for avoiding the common cold or flu:  Marland Kitchen Wash your hands often with soap and warm water for at least 20 seconds.  If soap and water are not readily available, use an alcohol-based hand sanitizer with at least 60% alcohol.  . If coughing or sneezing, cover your mouth and nose by coughing or sneezing into the elbow areas of your shirt or coat, into a tissue or into your sleeve (not your hands). . Avoid shaking hands with others and consider head nods or verbal greetings only. . Avoid touching your eyes, nose, or mouth with unwashed hands.  . Avoid close contact with people who are sick. . Avoid places or events with large numbers of people in one location, like concerts or sporting events. . Carefully consider travel plans you have or are making. . If you are planning any travel outside or inside the [place], visit the CDC's Travelers' Health webpage for the latest health notices. . If you have some symptoms but not all symptoms, continue to monitor at home and seek medical attention if your symptoms worsen. . If you are having a medical emergency, call 911.   ADDITIONAL HEALTHCARE OPTIONS FOR PATIENTS  [facility] / e-Visit: [email or web address]  [facility]: [phone]  [facility]: [phone]                   [facility]: [phone]  

## 2019-01-12 ENCOUNTER — Other Ambulatory Visit: Payer: Self-pay

## 2019-01-12 ENCOUNTER — Ambulatory Visit (INDEPENDENT_AMBULATORY_CARE_PROVIDER_SITE_OTHER): Payer: Medicaid Other | Admitting: Obstetrics and Gynecology

## 2019-01-12 ENCOUNTER — Encounter: Payer: Self-pay | Admitting: Obstetrics and Gynecology

## 2019-01-12 VITALS — BP 96/65 | HR 66 | Ht 65.0 in | Wt 206.6 lb

## 2019-01-12 DIAGNOSIS — Z3483 Encounter for supervision of other normal pregnancy, third trimester: Secondary | ICD-10-CM

## 2019-01-12 LAB — POCT URINALYSIS DIPSTICK OB
Bilirubin, UA: NEGATIVE
Blood, UA: NEGATIVE
Glucose, UA: NEGATIVE
Ketones, UA: NEGATIVE
Nitrite, UA: NEGATIVE
Spec Grav, UA: 1.01 (ref 1.010–1.025)
Urobilinogen, UA: 0.2 E.U./dL
pH, UA: 6.5 (ref 5.0–8.0)

## 2019-01-12 NOTE — Progress Notes (Signed)
ROB: No specific complaints.  Even though she is gaining weight she still has the overall sense that her baby is too small.  Fundal height is appropriate.

## 2019-01-12 NOTE — Progress Notes (Signed)
Patient comes in for [place] visit. She is having some lower back pain.

## 2019-01-13 NOTE — Telephone Encounter (Signed)
Referral has been completed and pt is aware of referral please see another phone encounter.

## 2019-02-01 ENCOUNTER — Ambulatory Visit (INDEPENDENT_AMBULATORY_CARE_PROVIDER_SITE_OTHER): Payer: Medicaid Other | Admitting: Obstetrics and Gynecology

## 2019-02-01 ENCOUNTER — Other Ambulatory Visit: Payer: Self-pay

## 2019-02-01 ENCOUNTER — Encounter: Payer: Self-pay | Admitting: Obstetrics and Gynecology

## 2019-02-01 VITALS — BP 137/90 | HR 55 | Wt 214.2 lb

## 2019-02-01 DIAGNOSIS — Z3483 Encounter for supervision of other normal pregnancy, third trimester: Secondary | ICD-10-CM

## 2019-02-01 DIAGNOSIS — Z308 Encounter for other contraceptive management: Secondary | ICD-10-CM

## 2019-02-01 DIAGNOSIS — R102 Pelvic and perineal pain: Secondary | ICD-10-CM

## 2019-02-01 DIAGNOSIS — D229 Melanocytic nevi, unspecified: Secondary | ICD-10-CM

## 2019-02-01 LAB — POCT URINALYSIS DIPSTICK OB
Bilirubin, UA: NEGATIVE
Blood, UA: NEGATIVE
Glucose, UA: NEGATIVE
Ketones, UA: NEGATIVE
Nitrite, UA: NEGATIVE
Spec Grav, UA: 1.025 (ref 1.010–1.025)
Urobilinogen, UA: 0.2 E.U./dL
pH, UA: 6 (ref 5.0–8.0)

## 2019-02-01 NOTE — Patient Instructions (Signed)

## 2019-02-01 NOTE — Progress Notes (Signed)
ROB: Patient complains of pelvic pain and pressure.  Has questions regarding her baby's size (states people are telling her that her belly is too small for her gestataional age), given reassurance. Answered questions regarding breastfeeding. Desires for mole to be removed from her left eye as it is painful. Discussed that it could possibly be removed postpartum. Notes swelling of legs, discussed use of compression socks. Patient also notes that she now desires BTL. Education given regarding options for contraception, including barrier methods, injectable contraception, IUD placement, oral contraceptives, NuvaRing, Nexplanon, and permanent sterlization.  BTL papers signed today. 36 week cultures done today. RTC in 1 week.

## 2019-02-01 NOTE — Progress Notes (Signed)
ROB-Pt present today for 36 week cultures and prenatal care. Pt stated having pain and pressure in the vaginal area.

## 2019-02-03 LAB — STREP GP B NAA: Strep Gp B NAA: POSITIVE — AB

## 2019-02-08 ENCOUNTER — Encounter: Payer: Medicaid Other | Admitting: Obstetrics and Gynecology

## 2019-02-08 LAB — GC/CHLAMYDIA PROBE AMP
Chlamydia trachomatis, NAA: NEGATIVE
Neisseria Gonorrhoeae by PCR: NEGATIVE

## 2019-02-09 ENCOUNTER — Encounter: Payer: Self-pay | Admitting: Obstetrics and Gynecology

## 2019-02-09 ENCOUNTER — Other Ambulatory Visit: Payer: Self-pay

## 2019-02-09 ENCOUNTER — Ambulatory Visit (INDEPENDENT_AMBULATORY_CARE_PROVIDER_SITE_OTHER): Payer: Medicaid Other | Admitting: Obstetrics and Gynecology

## 2019-02-09 VITALS — BP 142/71 | HR 56 | Ht 65.0 in | Wt 220.0 lb

## 2019-02-09 DIAGNOSIS — Z3483 Encounter for supervision of other normal pregnancy, third trimester: Secondary | ICD-10-CM

## 2019-02-09 NOTE — Progress Notes (Signed)
Patient comes in today for ROB visit. No concerns today. 

## 2019-02-09 NOTE — Progress Notes (Signed)
ROB: No problems.  Patient continues to work full-time.

## 2019-02-15 ENCOUNTER — Telehealth: Payer: Self-pay | Admitting: Obstetrics and Gynecology

## 2019-02-15 ENCOUNTER — Inpatient Hospital Stay: Payer: Medicaid Other | Admitting: Anesthesiology

## 2019-02-15 ENCOUNTER — Emergency Department: Payer: Medicaid Other

## 2019-02-15 ENCOUNTER — Other Ambulatory Visit: Payer: Self-pay

## 2019-02-15 ENCOUNTER — Telehealth: Payer: Self-pay

## 2019-02-15 ENCOUNTER — Inpatient Hospital Stay
Admission: EM | Admit: 2019-02-15 | Discharge: 2019-02-18 | DRG: 796 | Disposition: A | Discharge status: Home / Self Care | Payer: Medicaid Other | Attending: Obstetrics and Gynecology | Admitting: Obstetrics and Gynecology

## 2019-02-15 DIAGNOSIS — O1503 Eclampsia in pregnancy, third trimester: Secondary | ICD-10-CM | POA: Diagnosis not present

## 2019-02-15 DIAGNOSIS — O99824 Streptococcus B carrier state complicating childbirth: Secondary | ICD-10-CM | POA: Diagnosis present

## 2019-02-15 DIAGNOSIS — I6783 Posterior reversible encephalopathy syndrome: Secondary | ICD-10-CM | POA: Diagnosis present

## 2019-02-15 DIAGNOSIS — Z1159 Encounter for screening for other viral diseases: Secondary | ICD-10-CM | POA: Diagnosis not present

## 2019-02-15 DIAGNOSIS — O99214 Obesity complicating childbirth: Secondary | ICD-10-CM | POA: Diagnosis present

## 2019-02-15 DIAGNOSIS — S0003XA Contusion of scalp, initial encounter: Secondary | ICD-10-CM | POA: Diagnosis present

## 2019-02-15 DIAGNOSIS — O9942 Diseases of the circulatory system complicating childbirth: Secondary | ICD-10-CM | POA: Diagnosis present

## 2019-02-15 DIAGNOSIS — Z3A37 37 weeks gestation of pregnancy: Secondary | ICD-10-CM | POA: Diagnosis not present

## 2019-02-15 DIAGNOSIS — O9A22 Injury, poisoning and certain other consequences of external causes complicating childbirth: Secondary | ICD-10-CM | POA: Diagnosis present

## 2019-02-15 DIAGNOSIS — O151 Eclampsia in labor: Principal | ICD-10-CM | POA: Diagnosis present

## 2019-02-15 DIAGNOSIS — O1493 Unspecified pre-eclampsia, third trimester: Secondary | ICD-10-CM

## 2019-02-15 DIAGNOSIS — Z302 Encounter for sterilization: Secondary | ICD-10-CM

## 2019-02-15 DIAGNOSIS — E669 Obesity, unspecified: Secondary | ICD-10-CM | POA: Diagnosis present

## 2019-02-15 DIAGNOSIS — O99820 Streptococcus B carrier state complicating pregnancy: Secondary | ICD-10-CM | POA: Diagnosis not present

## 2019-02-15 LAB — URINALYSIS, COMPLETE (UACMP) WITH MICROSCOPIC
Bilirubin Urine: NEGATIVE
Glucose, UA: NEGATIVE mg/dL
Ketones, ur: NEGATIVE mg/dL
Leukocytes,Ua: NEGATIVE
Nitrite: NEGATIVE
Protein, ur: 100 mg/dL — AB
Specific Gravity, Urine: 1.012 (ref 1.005–1.030)
pH: 6 (ref 5.0–8.0)

## 2019-02-15 LAB — COMPREHENSIVE METABOLIC PANEL
ALT: 17 U/L (ref 0–44)
ALT: 16 U/L (ref 0–44)
AST: 22 U/L (ref 15–41)
AST: 24 U/L (ref 15–41)
Albumin: 2.3 g/dL — ABNORMAL LOW (ref 3.5–5.0)
Albumin: 2.3 g/dL — ABNORMAL LOW (ref 3.5–5.0)
Alkaline Phosphatase: 168 U/L — ABNORMAL HIGH (ref 38–126)
Alkaline Phosphatase: 153 U/L — ABNORMAL HIGH (ref 38–126)
Anion gap: 9 (ref 5–15)
Anion gap: 11 (ref 5–15)
BUN: 10 mg/dL (ref 6–20)
BUN: 10 mg/dL (ref 6–20)
CO2: 19 mmol/L — ABNORMAL LOW (ref 22–32)
CO2: 16 mmol/L — ABNORMAL LOW (ref 22–32)
Calcium: 7.9 mg/dL — ABNORMAL LOW (ref 8.9–10.3)
Calcium: 7.5 mg/dL — ABNORMAL LOW (ref 8.9–10.3)
Chloride: 108 mmol/L (ref 98–111)
Chloride: 108 mmol/L (ref 98–111)
Creatinine, Ser: 0.93 mg/dL (ref 0.44–1.00)
Creatinine, Ser: 0.93 mg/dL (ref 0.44–1.00)
GFR calc Af Amer: >60 mL/min (ref >60)
GFR calc Af Amer: >60 mL/min (ref >60)
GFR calc non Af Amer: >60 mL/min (ref >60)
GFR calc non Af Amer: >60 mL/min (ref >60)
Glucose, Bld: 78 mg/dL (ref 70–99)
Glucose, Bld: 100 mg/dL — ABNORMAL HIGH (ref 70–99)
Potassium: 3.6 mmol/L (ref 3.5–5.1)
Potassium: 3.8 mmol/L (ref 3.5–5.1)
Sodium: 136 mmol/L (ref 135–145)
Sodium: 135 mmol/L (ref 135–145)
Total Bilirubin: 0.4 mg/dL (ref 0.3–1.2)
Total Bilirubin: 0.2 mg/dL — ABNORMAL LOW (ref 0.3–1.2)
Total Protein: 6.3 g/dL — ABNORMAL LOW (ref 6.5–8.1)
Total Protein: 6 g/dL — ABNORMAL LOW (ref 6.5–8.1)

## 2019-02-15 LAB — MAGNESIUM
Magnesium: 2.3 mg/dL (ref 1.7–2.4)
Magnesium: 5.3 mg/dL — ABNORMAL HIGH (ref 1.7–2.4)

## 2019-02-15 LAB — CBC
HCT: 42.7 % (ref 36.0–46.0)
HCT: 38.6 % (ref 36.0–46.0)
Hemoglobin: 14.1 g/dL (ref 12.0–15.0)
Hemoglobin: 13 g/dL (ref 12.0–15.0)
MCH: 29.4 pg (ref 26.0–34.0)
MCH: 29.3 pg (ref 26.0–34.0)
MCHC: 33 g/dL (ref 30.0–36.0)
MCHC: 33.7 g/dL (ref 30.0–36.0)
MCV: 89.1 fL (ref 80.0–100.0)
MCV: 87.1 fL (ref 80.0–100.0)
Platelets: 238 10*3/uL (ref 150–400)
Platelets: 213 10*3/uL (ref 150–400)
RBC: 4.79 MIL/uL (ref 3.87–5.11)
RBC: 4.43 MIL/uL (ref 3.87–5.11)
RDW: 13.4 % (ref 11.5–15.5)
RDW: 13.7 % (ref 11.5–15.5)
WBC: 10.2 10*3/uL (ref 4.0–10.5)
WBC: 10.2 10*3/uL (ref 4.0–10.5)
nRBC: 0.3 % — ABNORMAL HIGH (ref 0.0–0.2)
nRBC: 0.4 % — ABNORMAL HIGH (ref 0.0–0.2)

## 2019-02-15 LAB — CBC WITH DIFFERENTIAL/PLATELET
Abs Immature Granulocytes: 0.14 10*3/uL — ABNORMAL HIGH (ref 0.00–0.07)
Basophils Absolute: 0 10*3/uL (ref 0.0–0.1)
Basophils Relative: 0 %
Eosinophils Absolute: 0.2 10*3/uL (ref 0.0–0.5)
Eosinophils Relative: 2 %
HCT: 40.9 % (ref 36.0–46.0)
Hemoglobin: 13.4 g/dL (ref 12.0–15.0)
Immature Granulocytes: 2 %
Lymphocytes Relative: 22 %
Lymphs Abs: 1.9 10*3/uL (ref 0.7–4.0)
MCH: 29.6 pg (ref 26.0–34.0)
MCHC: 32.8 g/dL (ref 30.0–36.0)
MCV: 90.5 fL (ref 80.0–100.0)
Monocytes Absolute: 0.6 10*3/uL (ref 0.1–1.0)
Monocytes Relative: 7 %
Neutro Abs: 5.7 10*3/uL (ref 1.7–7.7)
Neutrophils Relative %: 67 %
Platelets: 211 10*3/uL (ref 150–400)
RBC: 4.52 MIL/uL (ref 3.87–5.11)
RDW: 13.5 % (ref 11.5–15.5)
WBC: 8.5 10*3/uL (ref 4.0–10.5)
nRBC: 0.5 % — ABNORMAL HIGH (ref 0.0–0.2)

## 2019-02-15 LAB — URINE DRUG SCREEN, QUALITATIVE (ARMC ONLY)
Amphetamines, Ur Screen: NOT DETECTED
Barbiturates, Ur Screen: NOT DETECTED
Benzodiazepine, Ur Scrn: NOT DETECTED
Cannabinoid 50 Ng, Ur ~~LOC~~: NOT DETECTED
Cocaine Metabolite,Ur ~~LOC~~: NOT DETECTED
MDMA (Ecstasy)Ur Screen: NOT DETECTED
Methadone Scn, Ur: NOT DETECTED
Opiate, Ur Screen: POSITIVE — AB
Phencyclidine (PCP) Ur S: NOT DETECTED
Tricyclic, Ur Screen: NOT DETECTED

## 2019-02-15 LAB — URIC ACID: Uric Acid, Serum: 10 mg/dL — ABNORMAL HIGH (ref 2.5–7.1)

## 2019-02-15 LAB — TYPE AND SCREEN
ABO/RH(D): B POS
Antibody Screen: NEGATIVE

## 2019-02-15 LAB — SARS CORONAVIRUS 2 BY RT PCR (HOSPITAL ORDER, PERFORMED IN ~~LOC~~ HOSPITAL LAB): SARS Coronavirus 2: NEGATIVE

## 2019-02-15 LAB — BASIC METABOLIC PANEL
Anion gap: 10 (ref 5–15)
BUN: 11 mg/dL (ref 6–20)
CO2: 17 mmol/L — ABNORMAL LOW (ref 22–32)
Calcium: 8 mg/dL — ABNORMAL LOW (ref 8.9–10.3)
Chloride: 110 mmol/L (ref 98–111)
Creatinine, Ser: 1.07 mg/dL — ABNORMAL HIGH (ref 0.44–1.00)
GFR calc Af Amer: >60 mL/min (ref >60)
GFR calc non Af Amer: >60 mL/min (ref >60)
Glucose, Bld: 81 mg/dL (ref 70–99)
Potassium: 3.8 mmol/L (ref 3.5–5.1)
Sodium: 137 mmol/L (ref 135–145)

## 2019-02-15 LAB — PROTEIN / CREATININE RATIO, URINE
Creatinine, Urine: 75 mg/dL
Protein Creatinine Ratio: 11.65 mg/mg{Cre} — ABNORMAL HIGH (ref 0.00–0.15)
Total Protein, Urine: 874 mg/dL

## 2019-02-15 LAB — HEPATIC FUNCTION PANEL
ALT: 16 U/L (ref 0–44)
AST: 23 U/L (ref 15–41)
Albumin: 2.4 g/dL — ABNORMAL LOW (ref 3.5–5.0)
Alkaline Phosphatase: 158 U/L — ABNORMAL HIGH (ref 38–126)
Bilirubin, Direct: <0.1 mg/dL (ref 0.0–0.2)
Total Bilirubin: 0.3 mg/dL (ref 0.3–1.2)
Total Protein: 6.1 g/dL — ABNORMAL LOW (ref 6.5–8.1)

## 2019-02-15 LAB — LACTATE DEHYDROGENASE: LDH: 260 U/L — ABNORMAL HIGH (ref 98–192)

## 2019-02-15 MED ORDER — OXYTOCIN BOLUS FROM INFUSION
500.0000 mL | Freq: Once | INTRAVENOUS | Status: AC
  Administered 2019-02-15: 500 mL via INTRAVENOUS

## 2019-02-15 MED ORDER — OXYCODONE-ACETAMINOPHEN 5-325 MG PO TABS: 1.0000 | ORAL_TABLET | ORAL | Status: DC | PRN

## 2019-02-15 MED ORDER — LACTATED RINGERS IV SOLN
INTRAVENOUS | Status: DC
  Administered 2019-02-15 (×2): via INTRAVENOUS

## 2019-02-15 MED ORDER — SODIUM CHLORIDE 0.9 % IV SOLN
INTRAVENOUS | Status: DC | PRN
  Administered 2019-02-15 (×3): 5 mL via EPIDURAL

## 2019-02-15 MED ORDER — LABETALOL HCL 5 MG/ML IV SOLN
20.0000 mg | INTRAVENOUS | Status: DC | PRN
  Administered 2019-02-15: 16:00:00 20 mg via INTRAVENOUS
  Filled 2019-02-15: qty 4

## 2019-02-15 MED ORDER — DIPHENHYDRAMINE HCL 50 MG/ML IJ SOLN: 12.5000 mg | INTRAMUSCULAR | Status: DC | PRN

## 2019-02-15 MED ORDER — HYDRALAZINE HCL 20 MG/ML IJ SOLN
5.0000 mg | Freq: Once | INTRAMUSCULAR | Status: AC
  Administered 2019-02-15: 12:00:00 5 mg via INTRAVENOUS

## 2019-02-15 MED ORDER — HYDRALAZINE HCL 20 MG/ML IJ SOLN
10.0000 mg | INTRAMUSCULAR | Status: DC | PRN
  Administered 2019-02-15: 10 mg via INTRAVENOUS
  Filled 2019-02-15: qty 1

## 2019-02-15 MED ORDER — LORAZEPAM 2 MG/ML IJ SOLN: 1.0000 mg | INTRAMUSCULAR | Status: DC | PRN

## 2019-02-15 MED ORDER — LIDOCAINE HCL (PF) 1 % IJ SOLN
INTRAMUSCULAR | Status: AC
  Filled 2019-02-15: qty 30

## 2019-02-15 MED ORDER — FENTANYL 2.5 MCG/ML W/ROPIVACAINE 0.15% IN NS 100 ML EPIDURAL (ARMC)
EPIDURAL | Status: AC
  Filled 2019-02-15: qty 100

## 2019-02-15 MED ORDER — AMMONIA AROMATIC IN INHA
RESPIRATORY_TRACT | Status: AC
  Filled 2019-02-15: qty 10

## 2019-02-15 MED ORDER — PHENYLEPHRINE 40 MCG/ML (10ML) SYRINGE FOR IV PUSH (FOR BLOOD PRESSURE SUPPORT): 80.0000 ug | PREFILLED_SYRINGE | INTRAVENOUS | Status: DC | PRN

## 2019-02-15 MED ORDER — BUTORPHANOL TARTRATE 2 MG/ML IJ SOLN
1.0000 mg | INTRAMUSCULAR | Status: DC | PRN
  Administered 2019-02-15: 1 mg via INTRAVENOUS
  Filled 2019-02-15: qty 1

## 2019-02-15 MED ORDER — LACTATED RINGERS IV SOLN: 500.0000 mL | INTRAVENOUS | Status: DC | PRN

## 2019-02-15 MED ORDER — NIFEDIPINE 10 MG PO CAPS: 20.0000 mg | ORAL_CAPSULE | ORAL | Status: DC | PRN

## 2019-02-15 MED ORDER — LABETALOL HCL 5 MG/ML IV SOLN: 40.0000 mg | INTRAVENOUS | Status: DC | PRN

## 2019-02-15 MED ORDER — LACTATED RINGERS IV SOLN: 500.0000 mL | Freq: Once | INTRAVENOUS | Status: DC

## 2019-02-15 MED ORDER — CALCIUM GLUCONATE 10 % IV SOLN
INTRAVENOUS | Status: AC
  Filled 2019-02-15: qty 10

## 2019-02-15 MED ORDER — OXYTOCIN 40 UNITS IN NORMAL SALINE INFUSION - SIMPLE MED
2.5000 [IU]/h | INTRAVENOUS | Status: DC
  Filled 2019-02-15: qty 1000

## 2019-02-15 MED ORDER — LABETALOL HCL 5 MG/ML IV SOLN
40.0000 mg | INTRAVENOUS | Status: DC | PRN
  Administered 2019-02-15: 18:00:00 40 mg via INTRAVENOUS
  Filled 2019-02-15 (×2): qty 8

## 2019-02-15 MED ORDER — LABETALOL HCL 5 MG/ML IV SOLN: 10.0000 mg | Freq: Once | INTRAVENOUS | Status: DC

## 2019-02-15 MED ORDER — MISOPROSTOL 200 MCG PO TABS
ORAL_TABLET | ORAL | Status: AC
  Administered 2019-02-15: 20:00:00 50 ug via VAGINAL
  Filled 2019-02-15: qty 4

## 2019-02-15 MED ORDER — LIDOCAINE HCL (PF) 1 % IJ SOLN
INTRAMUSCULAR | Status: DC | PRN
  Administered 2019-02-15: 2 mL

## 2019-02-15 MED ORDER — HYDRALAZINE HCL 20 MG/ML IJ SOLN
5.0000 mg | Freq: Once | INTRAMUSCULAR | Status: AC
  Administered 2019-02-15: 5 mg via INTRAVENOUS

## 2019-02-15 MED ORDER — LACTATED RINGERS IV SOLN: INTRAVENOUS | Status: DC

## 2019-02-15 MED ORDER — MISOPROSTOL 50MCG HALF TABLET
50.0000 ug | ORAL_TABLET | ORAL | Status: DC | PRN
  Administered 2019-02-15: 50 ug via VAGINAL
  Filled 2019-02-15: qty 1

## 2019-02-15 MED ORDER — OXYTOCIN 40 UNITS IN NORMAL SALINE INFUSION - SIMPLE MED
1.0000 m[IU]/min | INTRAVENOUS | Status: DC
  Filled 2019-02-15: qty 1000

## 2019-02-15 MED ORDER — LIDOCAINE HCL (PF) 1 % IJ SOLN: 30.0000 mL | INTRAMUSCULAR | Status: DC | PRN

## 2019-02-15 MED ORDER — MISOPROSTOL 200 MCG PO TABS
ORAL_TABLET | ORAL | Status: AC
  Filled 2019-02-15: qty 4

## 2019-02-15 MED ORDER — TERBUTALINE SULFATE 1 MG/ML IJ SOLN: 0.2500 mg | Freq: Once | INTRAMUSCULAR | Status: DC | PRN

## 2019-02-15 MED ORDER — SODIUM CHLORIDE 0.9 % IV SOLN
2.0000 g | Freq: Once | INTRAVENOUS | Status: AC
  Administered 2019-02-15: 2 g via INTRAVENOUS
  Filled 2019-02-15: qty 2000

## 2019-02-15 MED ORDER — HYDRALAZINE HCL 20 MG/ML IJ SOLN
5.0000 mg | Freq: Once | INTRAMUSCULAR | Status: AC
  Administered 2019-02-15: 10:00:00 5 mg via INTRAVENOUS
  Filled 2019-02-15: qty 1

## 2019-02-15 MED ORDER — MAGNESIUM SULFATE 4 GM/100ML IV SOLN
4.0000 g | Freq: Once | INTRAVENOUS | Status: AC
  Administered 2019-02-15: 10:00:00 4 g via INTRAVENOUS
  Filled 2019-02-15: qty 100

## 2019-02-15 MED ORDER — SOD CITRATE-CITRIC ACID 500-334 MG/5ML PO SOLN: 30.0000 mL | ORAL | Status: DC | PRN

## 2019-02-15 MED ORDER — FENTANYL 2.5 MCG/ML W/ROPIVACAINE 0.15% IN NS 100 ML EPIDURAL (ARMC)
12.0000 mL/h | EPIDURAL | Status: DC
  Administered 2019-02-15: 12 mL/h via EPIDURAL

## 2019-02-15 MED ORDER — MAGNESIUM SULFATE 40 G IN LACTATED RINGERS - SIMPLE
2.0000 g/h | INTRAVENOUS | Status: DC
  Administered 2019-02-15: 2 g/h via INTRAVENOUS
  Filled 2019-02-15: qty 500

## 2019-02-15 MED ORDER — NIFEDIPINE 10 MG PO CAPS
10.0000 mg | ORAL_CAPSULE | ORAL | Status: DC | PRN
  Administered 2019-02-16: 01:00:00 10 mg via ORAL
  Filled 2019-02-15: qty 1

## 2019-02-15 MED ORDER — OXYTOCIN 10 UNIT/ML IJ SOLN
INTRAMUSCULAR | Status: AC
  Filled 2019-02-15: qty 2

## 2019-02-15 MED ORDER — SODIUM CHLORIDE 0.9 % IV SOLN: 1.0000 g | INTRAVENOUS | Status: DC

## 2019-02-15 MED ORDER — OXYCODONE-ACETAMINOPHEN 5-325 MG PO TABS: 2.0000 | ORAL_TABLET | ORAL | Status: DC | PRN

## 2019-02-15 MED ORDER — EPHEDRINE 5 MG/ML INJ: 10.0000 mg | INTRAVENOUS | Status: DC | PRN

## 2019-02-15 MED ORDER — LABETALOL HCL 5 MG/ML IV SOLN
80.0000 mg | INTRAVENOUS | Status: DC | PRN
  Administered 2019-02-15: 80 mg via INTRAVENOUS
  Filled 2019-02-15: qty 16

## 2019-02-15 MED ORDER — ONDANSETRON HCL 4 MG/2ML IJ SOLN: 4.0000 mg | Freq: Four times a day (QID) | INTRAMUSCULAR | Status: DC | PRN

## 2019-02-15 MED ORDER — MISOPROSTOL 25 MCG QUARTER TABLET
ORAL_TABLET | ORAL | Status: AC
  Administered 2019-02-15: 16:00:00 50 ug
  Filled 2019-02-15: qty 2

## 2019-02-15 MED ORDER — MORPHINE SULFATE (PF) 4 MG/ML IV SOLN
4.0000 mg | Freq: Once | INTRAVENOUS | Status: AC
  Administered 2019-02-15: 4 mg via INTRAVENOUS
  Filled 2019-02-15: qty 1

## 2019-02-15 MED ORDER — ACETAMINOPHEN 325 MG PO TABS: 650.0000 mg | ORAL_TABLET | ORAL | Status: DC | PRN

## 2019-02-15 NOTE — Anesthesia Preprocedure Evaluation (Signed)
Anesthesia Evaluation  Patient identified by MRN, date of birth, ID band Patient awake    Reviewed: Allergy & Precautions, H&P , NPO status , Patient's Chart, lab work & pertinent test results  Airway Mallampati: III  TM Distance: >3 FB Neck ROM: full    Dental   Pulmonary neg pulmonary ROS,           Cardiovascular Exercise Tolerance: Good hypertension ( ),   Presented to ED post-ictal following ecclamptic seizure at home, with severe range BP's. Now on Mg infusion, with BP's normalized following labetalol and hydralazine   Neuro/Psych Seizures -,  Head CT obtained in ED due to concern that she hit her head during seizure at home.  Findings were consistent with PRES.  Pt denies any numbness, paresthesias, or weakness.  She has a mild headache which has persisted for the past 3 days and is now improving.  No vision changes.  No vomiting.  Denies feeling confused or foggy-headed.  She is alert and oriented x 3.    GI/Hepatic negative GI ROS,   Endo/Other    Renal/GU   negative genitourinary   Musculoskeletal   Abdominal   Peds  Hematology negative hematology ROS (+)   Anesthesia Other Findings No pain with neck flexion.  Non-focal exam.  Past Medical History: No date: Medical history non-contributory  History reviewed. No pertinent surgical history.  BMI    Body Mass Index: 36.32 kg/m      Reproductive/Obstetrics (+) Pregnancy                             Anesthesia Physical Anesthesia Plan  ASA: III  Anesthesia Plan: Epidural   Post-op Pain Management:    Induction:   PONV Risk Score and Plan:   Airway Management Planned:   Additional Equipment:   Intra-op Plan:   Post-operative Plan:   Informed Consent: I have reviewed the patients History and Physical, chart, labs and discussed the procedure including the risks, benefits and alternatives for the proposed anesthesia with  the patient or authorized representative who has indicated his/her understanding and acceptance.       Plan Discussed with: Anesthesiologist  Anesthesia Plan Comments: (Discussed risks of epidural.  No signs of elevated ICP at this time.  Pt acknowledges risks and requests epidural, so will proceed with epidural placement.)        Anesthesia Quick Evaluation

## 2019-02-15 NOTE — Progress Notes (Deleted)
ROB-pt present today for prenatal care.

## 2019-02-15 NOTE — Telephone Encounter (Signed)
Patient called stating she has had a bad headache for more than 24 hours. She is 37 weeks. She would like a call back. Thanks

## 2019-02-15 NOTE — Telephone Encounter (Signed)
Called patient back and her young son answered the phone. He  stated that his mom was at the hospital. I told him to let her know that we have returned call. I seen in the computer that she was being seen in ED.

## 2019-02-15 NOTE — Telephone Encounter (Signed)
Pt called no answer LM via voicemail to call the office for prescreening. 

## 2019-02-15 NOTE — ED Notes (Signed)
Patient transported to CT 

## 2019-02-15 NOTE — ED Notes (Signed)
Pt has mother at bedside due to pt post-ictal status.

## 2019-02-15 NOTE — Progress Notes (Signed)
Intrapartum Progress Note  S:  Patient beginning to feel contractions. Inquires as to when she may be able to get an epidural.   O: . Vitals:   02/15/19 1938 02/15/19 1953 02/15/19 1955 02/15/19 2000  BP: (!) 174/101 (!) 122/94    Pulse: 74 83    Resp:    17  Temp:      TempSrc:      SpO2:   97% 96%  Weight:      Height:        Gen App: NAD, comfortable Abdomen: soft, gravid FHT: baseline 125 bpm.  Accels present.  Decels absent. moderate in degree variability.   Tocometer: contractions  Irregular, difficult to pick up on tocometer Cervix: 2/40-50/-3 Extremities: Nontender, no edema.  Pitocin:  None  Labs: No new labs   Assessment:  1: SIUP at [redacted]w[redacted]d 2. Eclampsia of pregnancy 3. GBS positive   Plan:  1. Continue IOL with Cytotec. Second dose placed. Foley bulb placed.  2. Continue Magnesium Sulfate for eclampsia. Will order PIH labs q 8 hrs 3. Will changed from IV Labetalol to PO Procardia as patient is having only modest responsiveness to medication.  4. Will initiate Ampicillin for GBS prophylaxis.   Rubie Maid, MD 02/15/2019 8:24 PM

## 2019-02-15 NOTE — Anesthesia Procedure Notes (Signed)
Epidural Patient location during procedure: OB Start time: 02/15/2019 10:15 PM End time: 02/15/2019 10:31 PM  Staffing Anesthesiologist: [name], MD Performed: anesthesiologist   Preanesthetic Checklist Completed: patient identified, site marked, surgical consent, pre-op evaluation, timeout performed, IV checked, risks and benefits discussed and monitors and equipment checked  Epidural Patient position: sitting Prep: ChloraPrep Patient monitoring: heart rate, continuous pulse ox and blood pressure Approach: midline Location: L4-L5 Injection technique: LOR saline  Needle:  Needle type: Tuohy  Needle gauge: 18 G Needle length: 9 cm and 9 Needle insertion depth: 7 cm Catheter type: closed end flexible Catheter size: 20 Guage Catheter at skin depth: 12 cm Test dose: negative and Other  Assessment Events: blood not aspirated, injection not painful, no injection resistance, negative IV test and no paresthesia  Additional Notes  Negative paresthesia.  Negative dural puncture.  Dose given in divided aliquots.  Patient tolerated the insertion well without complications.Reason for block:procedure for pain

## 2019-02-15 NOTE — ED Provider Notes (Signed)
[facility] Emergency Department Provider Note ____________________________________________   First MD Initiated Contact with Patient 02/15/19 [phone]     (approximate)  I have reviewed the triage vital signs and the nursing notes.   HISTORY  Chief Complaint Seizures  Level 5 caveat: History of present illness limited due to altered mental status  HPI Andrea Rivera is a 28 y.o. female with no significant past medical history who is currently [redacted] weeks pregnant and who presents with altered mental status and after a possible seizure or syncope.  Per EMS, the patient's son noted that she was in the bathroom for a while, and then heard her fall.  She was found unresponsive on the floor.  During transport, the patient appeared confused and postictal.  She is now waking up.  EMS noted an elevated blood pressure.  The patient's mother is here with her and denies any known history of preeclampsia, any other medical problems, or any complications during this pregnancy.  Past Medical History:  Diagnosis Date  . Medical history non-contributory     Patient Active Problem List   Diagnosis Date Noted  . Labor and delivery indication for care or intervention 06/08/2016    History reviewed. No pertinent surgical history.  Prior to Admission medications   Medication Sig Start Date End Date Taking? Authorizing Provider  multivitamin-iron-minerals-folic acid (CENTRUM) chewable tablet Chew 1 tablet by mouth daily. 12/02/18   Rubie Maid, MD  Prenat-FeFum-FePo-FA-DHA w/o A (PROVIDA DHA) 16-16-1.25-110 MG CAPS Take 1 capsule by mouth daily. 10/22/15   [provider]  Prenatal Vit-Fe Phos-FA-Omega (VITAFOL GUMMIES) 3.33-0.333-34.8 MG CHEW Chew 1 tablet by mouth daily. 11/30/18   Rubie Maid, MD    Allergies Patient has no known allergies.  Family History  Problem Relation Age of Onset  . Diabetes Mother   . Cancer Paternal Aunt     Social History Social  History   Tobacco Use  . Smoking status: Never Smoker  . Smokeless tobacco: Never Used  Substance Use Topics  . Alcohol use: No  . Drug use: No    Review of Systems Level 5 caveat: Unable to obtain review of systems due to altered mental status    ____________________________________________   PHYSICAL EXAM:  VITAL SIGNS: ED Triage Vitals  Enc Vitals Group     BP 02/15/19 0923 (!) 184/96     Pulse Rate 02/15/19 0923 70     Resp 02/15/19 0923 16     Temp 02/15/19 0931 98.7 F (37.1 C)     Temp Source 02/15/19 0931 Oral     SpO2 02/15/19 0923 98 %     Weight 02/15/19 0924 218 lb 4.1 oz (99 kg)     Height 02/15/19 0924 5\' 5"  (1.651 m)     Head Circumference --      Peak Flow --      Pain Score 02/15/19 0924 8     Pain Loc --      Pain Edu? --      Excl. in [place]? --     Constitutional: Somnolent appearing but arousable and responding to commands. Eyes: Conjunctivae are normal.  EOMI.  PERRLA. Head: Small amount of blood on the pillow behind the patient's head.  No active bleeding.  Examination and palpation of the scalp are limited by a hair weave but I cannot visualize or palpate any significant laceration or avulsion. Nose: No congestion/rhinnorhea. Mouth/Throat: Mucous membranes are moist.   Neck: Normal range of motion.  Cardiovascular: Normal rate, regular rhythm. Grossly normal heart sounds.  Good peripheral circulation. Respiratory: Normal respiratory effort.  No retractions. Lungs CTAB. Gastrointestinal: Gravid abdomen.  Soft and nontender. Genitourinary: No flank tenderness. Musculoskeletal: No lower extremity edema.  Extremities warm and well perfused.  Neurologic: Following commands.  Motor intact in all extremities. Skin:  Skin is warm and dry. No rash noted. Psychiatric: Unable to assess.  ____________________________________________   LABS (all labs ordered are listed, but only abnormal results are displayed)  Labs Reviewed  BASIC METABOLIC PANEL -  Abnormal; Notable for the following components:      Result Value   CO2 17 (*)    Creatinine, Ser 1.07 (*)    Calcium 8.0 (*)    All other components within normal limits  HEPATIC FUNCTION PANEL - Abnormal; Notable for the following components:   Total Protein 6.1 (*)    Albumin 2.4 (*)    Alkaline Phosphatase 158 (*)    All other components within normal limits  CBC WITH DIFFERENTIAL/PLATELET - Abnormal; Notable for the following components:   nRBC 0.5 (*)    Abs Immature Granulocytes 0.14 (*)    All other components within normal limits  LACTATE DEHYDROGENASE - Abnormal; Notable for the following components:   LDH 260 (*)    All other components within normal limits  URIC ACID - Abnormal; Notable for the following components:   Uric Acid, Serum 10.0 (*)    All other components within normal limits  SARS CORONAVIRUS 2 (HOSPITAL ORDER, [place] [facility])  MAGNESIUM  URINALYSIS, COMPLETE (UACMP) WITH MICROSCOPIC   ____________________________________________  EKG  ED ECG REPORT I, Arta Silence, the attending physician, personally viewed and interpreted this ECG.  Date: 02/15/2019 EKG Time: 0930 Rate: 60 Rhythm: normal sinus rhythm QRS Axis: normal Intervals: normal ST/T Wave abnormalities: normal Narrative Interpretation: no evidence of acute ischemia  ____________________________________________  RADIOLOGY  CT head: Occipital hypodensities concerning for PRES.  No acute hemorrhage.  ____________________________________________   PROCEDURES  Procedure(s) performed: No  Procedures  Critical Care performed: Yes  CRITICAL CARE Performed by: Arta Silence   Total critical care time: 60 minutes  Critical care time was exclusive of separately billable procedures and treating other patients.  Critical care was necessary to treat or prevent imminent or life-threatening deterioration.  Critical care was time spent personally by  me on the following activities: development of treatment plan with patient and/or surrogate as well as nursing, discussions with consultants, evaluation of patient's response to treatment, examination of patient, obtaining history from patient or surrogate, ordering and performing treatments and interventions, ordering and review of laboratory studies, ordering and review of radiographic studies, pulse oximetry and re-evaluation of patient's condition. ____________________________________________   INITIAL IMPRESSION / ASSESSMENT AND PLAN / ED COURSE  Pertinent labs & imaging results that were available during my care of the patient were reviewed by me and considered in my medical decision making (see chart for details).  28 year old female at [redacted] weeks pregnant presents with possible syncope versus seizure after she was found down in the bathroom by her son.  Per the mother, she has no known history of preeclampsia or prior complications during this pregnancy.  I reviewed the past medical records in [place] but do not see any relevant records related to this pregnancy.  On exam the patient is slightly somnolent appearing but arousable and following commands appropriately.  Neurologic exam is nonfocal.  There is a small amount of  blood on the pillow behind the patient's head suggesting a possible scalp laceration or abrasion.  Exam is limited by a hair weave but I am unable to palpate any significant laceration or avulsion of the scalp and there is no blood on my glove after examination.  There is no other visible trauma.  The blood pressure is elevated.  The other vital signs are normal.  The remainder of the exam is unremarkable.  Overall I am most concerned for preeclampsia and possible seizure.  Differential also includes syncope.  Given that the patient is still not fully alert and may have a head injury, she will require CT head to rule out ICH.  We will obtain labs for  preeclampsia, give hydralazine and magnesium, and closely monitor the patient.  I will consult OB/GYN to determine further management.  I anticipate the patient will need admission.  ----------------------------------------- 10:50 AM on 02/15/2019 -----------------------------------------  Blood pressure did not respond to initial hydralazine dose but now is significantly improved after second dose.  The patient is receiving IV magnesium.  Her mental status is improving and she is now more alert.  CT shows no acute traumatic findings or ICH but there are hypodensities compatible with PRES.  I consulted Dr. Marcelline Mates from OB/GYN.  She agrees with the current management of IV antihypertensives and IV magnesium, and will proceed with admitting the patient.  I counseled the patient and her mother on the results of the work-up so far and the plan of care.   ____________________________________________   FINAL CLINICAL IMPRESSION(S) / ED DIAGNOSES  Final diagnoses:  PRES (posterior reversible encephalopathy syndrome)  Pre-eclampsia in third trimester      NEW MEDICATIONS STARTED DURING THIS VISIT:  New Prescriptions   No medications on file     Note:  This document was prepared using Dragon voice recognition software and may include unintentional dictation errors.    Arta Silence, MD 02/15/19 1051

## 2019-02-15 NOTE — ED Triage Notes (Signed)
Pt comes EMS after possibly having a seizure. Pt is post-ictal with EMS. Pt has had no other complications with other pregnancies. BP high with EMS 186/119. CBG was 98. No other medical issues. [redacted] weeks pregnant.

## 2019-02-15 NOTE — H&P (Addendum)
Obstetric History and Physical  Andrea Rivera is a 28 y.o. [id number] with IUP at [redacted]w[redacted]d presenting from the Emergency Room for eclampsia of pregnancy.  She was brought in via EMS with altered mental status after being found unresponsive on the floor by a family member after being in the bathroom "for a while".  During transport, patient appeared to be confused and post-ictal, suspicious for seizure.  In the ER, she was also noted to have significantly elevated blood pressures with no prior history of chronic or gestational HTN, (180s/90s).  Concern was noted for possible head tauma due to fall, so CT scan was ordered.  She received 1 dose of IV hydralazine in the ER, and was initiated on Magnesium Sulfate 4 gram load.   Upon my assessment, patient still appears fatigued, but is aware of where she is. She notes that she does not remember what happened at home but "thinks she fell".  Patient denies she has been having  No  contractions, none vaginal bleeding, intact membranes, with active fetal movement.    Prenatal Course Source of Care: [facility] with onset of care at 9 weeks Pregnancy complications or risks: Patient Active Problem List   Diagnosis Date Noted  . Eclampsia affecting pregnancy in third trimester 02/15/2019  . Group B streptococcal carriage complicating pregnancy 58/52/7782  . Labor and delivery indication for care or intervention 06/08/2016   She plans to breastfeed She desires bilateral tubal ligation for postpartum contraception.   Prenatal labs and studies: ABO, Rh: --/--/B POS (06/17 1303) Antibody: NEG (06/17 1303) Rubella: 2.28 (12/06 1450) RPR: Non Reactive (04/01 1147)  HBsAg: Negative (12/06 1450)  HIV: Non Reactive (12/06 1450)  UMP:NTIRWERX (06/03 1610) 1 hr Glucola  normal Genetic screening normal Anatomy US normal   Past Medical History:  Diagnosis Date  . Medical history non-contributory     History reviewed. No pertinent surgical  history.  OB History  Gravida Para Term Preterm AB Living  3 2 2     2   SAB TAB Ectopic Multiple Live Births        0 2    # Outcome Date GA Lbr Len/2nd Weight Sex Delivery Anes PTL Lv  3 Current           2 Term 06/07/16 [redacted]w[redacted]d / 01:09 3572 g F Vag-Spont EPI  LIV  1 Term 08/15/10 [redacted]w[redacted]d   M Vag-Spont   LIV    Social History   Socioeconomic History  . Marital status: Single    Spouse name: Not on file  . Number of children: Not on file  . Years of education: Not on file  . Highest education level: Not on file  Occupational History  . Not on file  Social Needs  . Financial resource strain: Not on file  . Food insecurity    Worry: Not on file    Inability: Not on file  . Transportation needs    Medical: Not on file    Non-medical: Not on file  Tobacco Use  . Smoking status: Never Smoker  . Smokeless tobacco: Never Used  Substance and Sexual Activity  . Alcohol use: No  . Drug use: No  . Sexual activity: Yes    Birth control/protection: None  Lifestyle  . Physical activity    Days per week: Patient refused    Minutes per session: Patient refused  . Stress: Not on file  Relationships  . Social connections    Talks on phone: Patient refused  Gets together: Patient refused    Attends religious service: Patient refused    Active member of club or organization: Patient refused    Attends meetings of clubs or organizations: Patient refused    Relationship status: Patient refused  Other Topics Concern  . Not on file  Social History Narrative  . Not on file    Family History  Problem Relation Age of Onset  . Diabetes Mother   . Cancer Paternal Aunt     Medications Prior to Admission  Medication Sig Dispense Refill Last Dose  . multivitamin-iron-minerals-folic acid (CENTRUM) chewable tablet Chew 1 tablet by mouth daily. 30 tablet 11 02/15/2019 at Unknown time  . Prenatal Vit-Fe Phos-FA-Omega (VITAFOL GUMMIES) 3.33-0.333-34.8 MG CHEW Chew 1 tablet by mouth daily.  30 tablet 11 02/15/2019 at Unknown time    No Known Allergies  Review of Systems: Negative except for what is mentioned in HPI.  Physical Exam: BP (!) 162/89   Pulse 69   Temp 98.7 F (37.1 C) (Oral)   Resp (!) 22   Ht 5\' 5"  (1.651 m)   Wt 99 kg   LMP 05/31/2018 (Approximate)   SpO2 97%   BMI 36.32 kg/m  CONSTITUTIONAL: Well-developed, well-nourished female in no acute distress.  HENT:  Normocephalic, atraumatic, External right and left ear normal. Oropharynx is clear and moist EYES: Conjunctivae and EOM are normal. Pupils are equal, round, and reactive to light. No scleral icterus.  NECK: Normal range of motion, supple, no masses SKIN: Skin is warm and dry. No rash noted. Not diaphoretic. No erythema. No pallor. NEUROLOGIC: Alert and oriented to person, place, and time. Normal reflexes, muscle tone coordination. No cranial nerve deficit noted. PSYCHIATRIC: Normal mood and affect. Normal behavior. Normal judgment and thought content. CARDIOVASCULAR: Normal heart rate noted, regular rhythm RESPIRATORY: Effort and breath sounds normal, no problems with respiration noted ABDOMEN: Soft, nontender, nondistended, gravid. MUSCULOSKELETAL: Normal range of motion. No edema and no tenderness. 2+ distal pulses.  Cervical Exam: Dilatation 0.5 cm   Effacement 30-40%   Station ballotable   Presentation: cephalic FHT:  Baseline rate 145 bpm   Variability minimal to moderate.  Accelerations present   Decelerations none.  Contractions: Occasional   Pertinent Labs/Studies:   Results for orders placed or performed during the hospital encounter of 02/15/19 (from the past 24 hour(s))  Basic metabolic panel     Status: Abnormal   Collection Time: 02/15/19  9:30 AM  Result Value Ref Range   Sodium 137 135 - 145 mmol/L   Potassium 3.8 3.5 - 5.1 mmol/L   Chloride 110 98 - 111 mmol/L   CO2 17 (L) 22 - 32 mmol/L   Glucose, Bld 81 70 - 99 mg/dL   BUN 11 6 - 20 mg/dL   Creatinine, Ser 1.07 (H) 0.44  - 1.00 mg/dL   Calcium 8.0 (L) 8.9 - 10.3 mg/dL   GFR calc non Af Amer >60 >60 mL/min   GFR calc Af Amer >60 >60 mL/min   Anion gap 10 5 - 15  Hepatic function panel     Status: Abnormal   Collection Time: 02/15/19  9:30 AM  Result Value Ref Range   Total Protein 6.1 (L) 6.5 - 8.1 g/dL   Albumin 2.4 (L) 3.5 - 5.0 g/dL   AST 23 15 - 41 U/L   ALT 16 0 - 44 U/L   Alkaline Phosphatase 158 (H) 38 - 126 U/L   Total Bilirubin 0.3 0.3 - 1.2 mg/dL  Bilirubin, Direct <0.1 0.0 - 0.2 mg/dL   Indirect Bilirubin NOT CALCULATED 0.3 - 0.9 mg/dL  CBC with Differential     Status: Abnormal   Collection Time: 02/15/19  9:30 AM  Result Value Ref Range   WBC 8.5 4.0 - 10.5 K/uL   RBC 4.52 3.87 - 5.11 MIL/uL   Hemoglobin 13.4 12.0 - 15.0 g/dL   HCT 40.9 36.0 - 46.0 %   MCV 90.5 80.0 - 100.0 fL   MCH 29.6 26.0 - 34.0 pg   MCHC 32.8 30.0 - 36.0 g/dL   RDW 13.5 11.5 - 15.5 %   Platelets 211 150 - 400 K/uL   nRBC 0.5 (H) 0.0 - 0.2 %   Neutrophils Relative % 67 %   Neutro Abs 5.7 1.7 - 7.7 K/uL   Lymphocytes Relative 22 %   Lymphs Abs 1.9 0.7 - 4.0 K/uL   Monocytes Relative 7 %   Monocytes Absolute 0.6 0.1 - 1.0 K/uL   Eosinophils Relative 2 %   Eosinophils Absolute 0.2 0.0 - 0.5 K/uL   Basophils Relative 0 %   Basophils Absolute 0.0 0.0 - 0.1 K/uL   Immature Granulocytes 2 %   Abs Immature Granulocytes 0.14 (H) 0.00 - 0.07 K/uL  Lactate dehydrogenase     Status: Abnormal   Collection Time: 02/15/19  9:30 AM  Result Value Ref Range   LDH 260 (H) 98 - 192 U/L  Uric acid     Status: Abnormal   Collection Time: 02/15/19  9:30 AM  Result Value Ref Range   Uric Acid, Serum 10.0 (H) 2.5 - 7.1 mg/dL  Magnesium     Status: None   Collection Time: 02/15/19  9:30 AM  Result Value Ref Range   Magnesium 2.3 1.7 - 2.4 mg/dL  Urinalysis, Complete w Microscopic     Status: Abnormal   Collection Time: 02/15/19 10:55 AM  Result Value Ref Range   Color, Urine YELLOW (A) YELLOW   APPearance HAZY (A)  CLEAR   Specific Gravity, Urine 1.012 1.005 - 1.030   pH 6.0 5.0 - 8.0   Glucose, UA NEGATIVE NEGATIVE mg/dL   Hgb urine dipstick SMALL (A) NEGATIVE   Bilirubin Urine NEGATIVE NEGATIVE   Ketones, ur NEGATIVE NEGATIVE mg/dL   Protein, ur 100 (A) NEGATIVE mg/dL   Nitrite NEGATIVE NEGATIVE   Leukocytes,Ua NEGATIVE NEGATIVE   RBC / HPF 0-5 0 - 5 RBC/hpf   WBC, UA 6-10 0 - 5 WBC/hpf   Bacteria, UA RARE (A) NONE SEEN   Squamous Epithelial / LPF 0-5 0 - 5   Granular Casts, UA PRESENT   SARS Coronavirus 2 (CEPHEID - Performed in [place] hospital lab), Hosp Order     Status: None   Collection Time: 02/15/19 10:55 AM   Specimen: Nasopharyngeal Swab  Result Value Ref Range   SARS Coronavirus 2 NEGATIVE NEGATIVE  Protein / creatinine ratio, urine     Status: Abnormal   Collection Time: 02/15/19 10:55 AM  Result Value Ref Range   Creatinine, Urine 75 mg/dL   Total Protein, Urine 874 mg/dL   Protein Creatinine Ratio 11.65 (H) 0.00 - 0.15 mg/mg[Cre]  Urine Drug Screen, Qualitative ([facility] only)     Status: Abnormal   Collection Time: 02/15/19 10:55 AM  Result Value Ref Range   Tricyclic, Ur Screen NONE DETECTED NONE DETECTED   Amphetamines, Ur Screen NONE DETECTED NONE DETECTED   MDMA (Ecstasy)Ur Screen NONE DETECTED NONE DETECTED   Cocaine Metabolite,Ur [place] NONE  DETECTED NONE DETECTED   Opiate, Ur Screen POSITIVE (A) NONE DETECTED   Phencyclidine (PCP) Ur S NONE DETECTED NONE DETECTED   Cannabinoid 50 Ng, Ur [place] NONE DETECTED NONE DETECTED   Barbiturates, Ur Screen NONE DETECTED NONE DETECTED   Benzodiazepine, Ur Scrn NONE DETECTED NONE DETECTED   Methadone Scn, Ur NONE DETECTED NONE DETECTED  Comprehensive metabolic panel     Status: Abnormal   Collection Time: 02/15/19  1:03 PM  Result Value Ref Range   Sodium 136 135 - 145 mmol/L   Potassium 3.6 3.5 - 5.1 mmol/L   Chloride 108 98 - 111 mmol/L   CO2 19 (L) 22 - 32 mmol/L   Glucose, Bld 78 70 - 99 mg/dL   BUN 10 6 - 20 mg/dL    Creatinine, Ser 0.93 0.44 - 1.00 mg/dL   Calcium 7.9 (L) 8.9 - 10.3 mg/dL   Total Protein 6.3 (L) 6.5 - 8.1 g/dL   Albumin 2.3 (L) 3.5 - 5.0 g/dL   AST 22 15 - 41 U/L   ALT 17 0 - 44 U/L   Alkaline Phosphatase 168 (H) 38 - 126 U/L   Total Bilirubin 0.4 0.3 - 1.2 mg/dL   GFR calc non Af Amer >60 >60 mL/min   GFR calc Af Amer >60 >60 mL/min   Anion gap 9 5 - 15  CBC     Status: Abnormal   Collection Time: 02/15/19  1:03 PM  Result Value Ref Range   WBC 10.2 4.0 - 10.5 K/uL   RBC 4.79 3.87 - 5.11 MIL/uL   Hemoglobin 14.1 12.0 - 15.0 g/dL   HCT 42.7 36.0 - 46.0 %   MCV 89.1 80.0 - 100.0 fL   MCH 29.4 26.0 - 34.0 pg   MCHC 33.0 30.0 - 36.0 g/dL   RDW 13.4 11.5 - 15.5 %   Platelets 238 150 - 400 K/uL   nRBC 0.3 (H) 0.0 - 0.2 %  Type and screen [facility]     Status: None   Collection Time: 02/15/19  1:03 PM  Result Value Ref Range   ABO/RH(D) B POS    Antibody Screen NEG    Sample Expiration      02/18/2019,2359 Performed at [facility], [address] [place], [place] [address]     Imaging:  CT Head Wo Contrast CLINICAL DATA:  Thirty-eight weeks pregnant. Seizure. Head injury. Hypertension.  EXAM: CT HEAD WITHOUT CONTRAST  TECHNIQUE: Contiguous axial images were obtained from the base of the skull through the vertex without intravenous contrast.  COMPARISON:  None.  FINDINGS: Brain: Image quality degraded by motion. Multiple images repeated due to motion.  Subtle hypodensity in both occipital lobes. More prominent hypodensity in the right medial parietal convexity. No acute hemorrhage.  Ventricle size normal.  No midline shift.  Vascular: Negative for hyperdense vessel  Skull: Negative  Sinuses/Orbits: Negative  Other: Right frontal scalp contusion due to head injury.  IMPRESSION: Hypodensity right medial parietal convexity without hemorrhage. Subtle hypodensity in both occipital lobes. 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 Recommend MRI brain without and with contrast for further evaluation.  Right frontal scalp contusion due to head injury from seizure.  These results were called by telephone at the time of interpretation on 02/15/2019 at 10:22 am to Dr. Arta Silence , who verbally acknowledged these results.  Electronically Signed   By: Franchot Gallo  M.D.   On: 02/15/2019 10:24   Bedside Ultrasound: SIUP, vertex, EGA 35.4 weeks with EDD 03/18/2019, EFW 5 lbs 10 oz, AFI 7.2 cm, Anterior placenta.   Assessment : Andrea Rivera is a 28 y.o. G3P2002 at 106w1d being admitted for induction of labor due to eclampsia of pregnancy. GBS positive status.   Plan: Labor:  Induction as ordered as per protocol with Cytotec. First dose given at 3:51 pm. Analgesia as needed.  Patient initially still fatigued, however after another hour of monitoring has become more alert and responsive.  Initial concern on CT for posterior reversible encephalopathy syndrome due to eclampsia, however patient appears to be responding well with no obvious neurologic deficit. Will continue to monitor closely and if any signs present, will proceed with MRI. Continue Magnesium Sulfate at 2 grams/hr.  Started IV Labetalol protocol for management of BPs. SCDs in place.   FWB: Reassuring fetal heart tracing.  GBS positive. Will initiate prophylaxis once patient further into active labor.   Delivery plan: Hopeful for vaginal delivery     Andrea Maid, MD [facility]

## 2019-02-16 ENCOUNTER — Encounter: Payer: Medicaid Other | Admitting: Obstetrics and Gynecology

## 2019-02-16 DIAGNOSIS — O151 Eclampsia in labor: Principal | ICD-10-CM

## 2019-02-16 DIAGNOSIS — O99824 Streptococcus B carrier state complicating childbirth: Secondary | ICD-10-CM

## 2019-02-16 LAB — COMPREHENSIVE METABOLIC PANEL WITH GFR
ALT: 18 U/L (ref 0–44)
AST: 27 U/L (ref 15–41)
Albumin: 2 g/dL — ABNORMAL LOW (ref 3.5–5.0)
Alkaline Phosphatase: 137 U/L — ABNORMAL HIGH (ref 38–126)
Anion gap: 9 (ref 5–15)
BUN: 9 mg/dL (ref 6–20)
CO2: 18 mmol/L — ABNORMAL LOW (ref 22–32)
Calcium: 6.9 mg/dL — ABNORMAL LOW (ref 8.9–10.3)
Chloride: 108 mmol/L (ref 98–111)
Creatinine, Ser: 1.01 mg/dL — ABNORMAL HIGH (ref 0.44–1.00)
GFR calc Af Amer: >60 mL/min
GFR calc non Af Amer: >60 mL/min
Glucose, Bld: 82 mg/dL (ref 70–99)
Potassium: 4 mmol/L (ref 3.5–5.1)
Sodium: 135 mmol/L (ref 135–145)
Total Bilirubin: 0.3 mg/dL (ref 0.3–1.2)
Total Protein: 5.3 g/dL — ABNORMAL LOW (ref 6.5–8.1)

## 2019-02-16 LAB — CBC
HCT: 38.2 % (ref 36.0–46.0)
HCT: 42.7 % (ref 36.0–46.0)
Hemoglobin: 13.2 g/dL (ref 12.0–15.0)
Hemoglobin: 14.5 g/dL (ref 12.0–15.0)
MCH: 29.7 pg (ref 26.0–34.0)
MCH: 29.8 pg (ref 26.0–34.0)
MCHC: 34.6 g/dL (ref 30.0–36.0)
MCHC: 34 g/dL (ref 30.0–36.0)
MCV: 86 fL (ref 80.0–100.0)
MCV: 87.7 fL (ref 80.0–100.0)
Platelets: 206 10*3/uL (ref 150–400)
Platelets: 169 10*3/uL (ref 150–400)
RBC: 4.44 MIL/uL (ref 3.87–5.11)
RBC: 4.87 MIL/uL (ref 3.87–5.11)
RDW: 13.8 % (ref 11.5–15.5)
RDW: 14 % (ref 11.5–15.5)
WBC: 16.8 10*3/uL — ABNORMAL HIGH (ref 4.0–10.5)
WBC: 11.9 10*3/uL — ABNORMAL HIGH (ref 4.0–10.5)
nRBC: 0.2 % (ref 0.0–0.2)
nRBC: 0.3 % — ABNORMAL HIGH (ref 0.0–0.2)

## 2019-02-16 LAB — COMPREHENSIVE METABOLIC PANEL
ALT: 16 U/L (ref 0–44)
AST: 25 U/L (ref 15–41)
Albumin: 2.1 g/dL — ABNORMAL LOW (ref 3.5–5.0)
Alkaline Phosphatase: 152 U/L — ABNORMAL HIGH (ref 38–126)
Anion gap: 9 (ref 5–15)
BUN: 10 mg/dL (ref 6–20)
CO2: 16 mmol/L — ABNORMAL LOW (ref 22–32)
Calcium: 7.1 mg/dL — ABNORMAL LOW (ref 8.9–10.3)
Chloride: 108 mmol/L (ref 98–111)
Creatinine, Ser: 0.99 mg/dL (ref 0.44–1.00)
GFR calc Af Amer: >60 mL/min (ref >60)
GFR calc non Af Amer: >60 mL/min (ref >60)
Glucose, Bld: 115 mg/dL — ABNORMAL HIGH (ref 70–99)
Potassium: 4 mmol/L (ref 3.5–5.1)
Sodium: 133 mmol/L — ABNORMAL LOW (ref 135–145)
Total Bilirubin: 0.4 mg/dL (ref 0.3–1.2)
Total Protein: 5.9 g/dL — ABNORMAL LOW (ref 6.5–8.1)

## 2019-02-16 LAB — MAGNESIUM
Magnesium: 6 mg/dL — ABNORMAL HIGH (ref 1.7–2.4)
Magnesium: 6.1 mg/dL (ref 1.7–2.4)

## 2019-02-16 LAB — RPR: RPR Ser Ql: NONREACTIVE

## 2019-02-16 MED ORDER — LABETALOL HCL 5 MG/ML IV SOLN
40.0000 mg | INTRAVENOUS | Status: DC | PRN
  Filled 2019-02-16: qty 8

## 2019-02-16 MED ORDER — HYDRALAZINE HCL 20 MG/ML IJ SOLN
INTRAMUSCULAR | Status: AC
  Filled 2019-02-16: qty 1

## 2019-02-16 MED ORDER — HYDRALAZINE HCL 20 MG/ML IJ SOLN: 10.0000 mg | INTRAMUSCULAR | Status: DC | PRN

## 2019-02-16 MED ORDER — HYDRALAZINE HCL 20 MG/ML IJ SOLN
5.0000 mg | INTRAMUSCULAR | Status: DC | PRN
  Administered 2019-02-16: 5 mg via INTRAVENOUS

## 2019-02-16 MED ORDER — BENZOCAINE-MENTHOL 20-0.5 % EX AERO
1.0000 "application " | INHALATION_SPRAY | CUTANEOUS | Status: DC | PRN
  Filled 2019-02-16: qty 56

## 2019-02-16 MED ORDER — ONDANSETRON HCL 4 MG/2ML IJ SOLN: 4.0000 mg | INTRAMUSCULAR | Status: DC | PRN

## 2019-02-16 MED ORDER — ZOLPIDEM TARTRATE 5 MG PO TABS: 5.0000 mg | ORAL_TABLET | Freq: Every evening | ORAL | Status: DC | PRN

## 2019-02-16 MED ORDER — FAMOTIDINE 20 MG PO TABS
40.0000 mg | ORAL_TABLET | Freq: Once | ORAL | Status: AC
  Administered 2019-02-17: 40 mg via ORAL
  Filled 2019-02-16: qty 2

## 2019-02-16 MED ORDER — PRENATAL MULTIVITAMIN CH
1.0000 | ORAL_TABLET | Freq: Every day | ORAL | Status: DC
  Administered 2019-02-16 – 2019-02-18 (×3): 1 via ORAL
  Filled 2019-02-16 (×3): qty 1

## 2019-02-16 MED ORDER — HYDRALAZINE HCL 20 MG/ML IJ SOLN
5.0000 mg | Freq: Once | INTRAMUSCULAR | Status: AC
  Administered 2019-02-16: 09:00:00 5 mg via INTRAVENOUS
  Filled 2019-02-16: qty 1

## 2019-02-16 MED ORDER — LACTATED RINGERS IV SOLN
INTRAVENOUS | Status: DC
  Administered 2019-02-16 (×2): via INTRAVENOUS

## 2019-02-16 MED ORDER — HYDRALAZINE HCL 20 MG/ML IJ SOLN
10.0000 mg | Freq: Once | INTRAMUSCULAR | Status: AC
  Administered 2019-02-16: 10 mg via INTRAVENOUS

## 2019-02-16 MED ORDER — DIPHENHYDRAMINE HCL 25 MG PO CAPS: 25.0000 mg | ORAL_CAPSULE | Freq: Four times a day (QID) | ORAL | Status: DC | PRN

## 2019-02-16 MED ORDER — ACETAMINOPHEN 325 MG PO TABS
650.0000 mg | ORAL_TABLET | ORAL | Status: DC | PRN
  Administered 2019-02-17: 650 mg via ORAL
  Filled 2019-02-16: qty 2

## 2019-02-16 MED ORDER — ONDANSETRON HCL 4 MG PO TABS: 4.0000 mg | ORAL_TABLET | ORAL | Status: DC | PRN

## 2019-02-16 MED ORDER — METOCLOPRAMIDE HCL 10 MG PO TABS
10.0000 mg | ORAL_TABLET | Freq: Once | ORAL | Status: AC
  Administered 2019-02-17: 10 mg via ORAL
  Filled 2019-02-16 (×2): qty 1

## 2019-02-16 MED ORDER — NIFEDIPINE ER OSMOTIC RELEASE 30 MG PO TB24
30.0000 mg | ORAL_TABLET | Freq: Two times a day (BID) | ORAL | Status: DC
  Administered 2019-02-16 – 2019-02-18 (×4): 30 mg via ORAL
  Filled 2019-02-16 (×4): qty 1

## 2019-02-16 MED ORDER — IBUPROFEN 600 MG PO TABS
600.0000 mg | ORAL_TABLET | Freq: Four times a day (QID) | ORAL | Status: DC
  Administered 2019-02-16 – 2019-02-18 (×10): 600 mg via ORAL
  Filled 2019-02-16 (×10): qty 1

## 2019-02-16 MED ORDER — SENNOSIDES-DOCUSATE SODIUM 8.6-50 MG PO TABS
2.0000 | ORAL_TABLET | ORAL | Status: DC
  Filled 2019-02-16: qty 2

## 2019-02-16 MED ORDER — HYDRALAZINE HCL 20 MG/ML IJ SOLN: 5.0000 mg | Freq: Once | INTRAMUSCULAR | Status: AC

## 2019-02-16 MED ORDER — MAGNESIUM SULFATE 40 G IN LACTATED RINGERS - SIMPLE
2.0000 g/h | INTRAVENOUS | Status: DC
  Administered 2019-02-16: 2 g/h via INTRAVENOUS
  Filled 2019-02-16: qty 500

## 2019-02-16 MED ORDER — COCONUT OIL OIL: 1.0000 "application " | TOPICAL_OIL | Status: DC | PRN

## 2019-02-16 MED ORDER — LACTATED RINGERS IV SOLN
INTRAVENOUS | Status: DC
  Administered 2019-02-17: 07:00:00 via INTRAVENOUS

## 2019-02-16 MED ORDER — LABETALOL HCL 5 MG/ML IV SOLN
20.0000 mg | INTRAVENOUS | Status: DC | PRN
  Filled 2019-02-16: qty 4

## 2019-02-16 MED ORDER — SIMETHICONE 80 MG PO CHEW: 80.0000 mg | CHEWABLE_TABLET | ORAL | Status: DC | PRN

## 2019-02-16 MED ORDER — DIBUCAINE (PERIANAL) 1 % EX OINT: 1.0000 "application " | TOPICAL_OINTMENT | CUTANEOUS | Status: DC | PRN

## 2019-02-16 MED ORDER — WITCH HAZEL-GLYCERIN EX PADS: 1.0000 "application " | MEDICATED_PAD | CUTANEOUS | Status: DC | PRN

## 2019-02-16 NOTE — Progress Notes (Signed)
Post Partum Day # 1, s/p SVD (precipitous).  Eclampsia of pregnancy.   Subjective: no complaints.  Patient not yet ambulatory due to continued postpartum magnesium. Denies headaches, blurred vision, RUQ pain. Notes bleeding is fine. Desires to see her baby in the NICU.   Objective: Temp:  [97.6 F (36.4 C)-98.7 F (37.1 C)] 97.8 F (36.6 C) (06/18 0723) Pulse Rate:  [60-99] 71 (06/18 0835) Resp:  [16-23] 18 (06/18 0723) BP: (119-184)/(59-119) 173/95 (06/18 0835) SpO2:  [93 %-100 %] 93 % (06/18 0840) Weight:  [99 kg] 99 kg (06/17 0924)  Physical Exam:  General: alert and no distress. Mild facial puffiness present Lungs: clear to auscultation bilaterally Breasts: normal appearance, no masses or tenderness Heart: regular rate and rhythm, S1, S2 normal, no murmur, click, rub or gallop Abdomen: soft, non-tender; bowel sounds normal; no masses,  no organomegaly Pelvis: Lochia: appropriate, Uterine Fundus: firm Extremities: DVT Evaluation: No evidence of DVT seen on physical exam. Negative Homan's sign. No cords or calf tenderness. No significant calf/ankle edema. SCDs in place.  Neurologic: Reflexes intact, no clonus present.    Recent Labs    02/15/19 2127 02/16/19 0455  HGB 13.0 13.2  HCT 38.6 38.2    Assessment/Plan: Continue Magnesium sulfate for 24 hrs postpartum.  Will continue Procardia for elevated BPs.  Continue neuro checks.  Patient desires to bottle feed.  Desires BTL for contraception.  Will schedule for today.  Continue routine postpartum care.  Desires circumcision for female infant.    LOS: 1 day   Rubie Maid, MD [facility] 02/16/2019 8:45 AM

## 2019-02-16 NOTE — Significant Event (Signed)
   Notified after delivery that patient received an extra magnesium bolus for 3 minutes immediately postpartu as the nurse thought it was the Pitocin IV line.  Most recent magnesium level was 5.3 at 2127.  Patient seems to appear slightly groggy but otherwise responsive and alert at this time. Will continue to monitor closely. Will repeat magnesium levels and administer calcium gluconate if patient's status changes.    Rubie Maid, MD [facility]

## 2019-02-16 NOTE — Progress Notes (Signed)
Post Partum Day # 1, s/p SVD (precipitous).  Eclampsia of pregnancy.   Subjective: no complaints and tolerating PO.  Patient not yet ambulatory due to continued postpartum magnesium. Denies headaches, blurred vision, RUQ pain. Bonding with infant.  Objective: Temp:  [97.6 F (36.4 C)-98 F (36.7 C)] 97.8 F (36.6 C) (06/18 1505) Pulse Rate:  [69-106] 89 (06/18 1703) Resp:  [16-20] 19 (06/18 1703) BP: (119-174)/(54-119) 125/72 (06/18 1703) SpO2:  [90 %-99 %] 94 % (06/18 1756)  Physical Exam:  General: alert and no distress. Mild facial puffiness present Lungs: clear to auscultation bilaterally Breasts: normal appearance, no masses or tenderness Heart: regular rate and rhythm, S1, S2 normal, no murmur, click, rub or gallop Abdomen: soft, non-tender; bowel sounds normal; no masses,  no organomegaly Pelvis: Lochia: appropriate, Uterine Fundus: firm Extremities: DVT Evaluation: No evidence of DVT seen on physical exam. Negative Homan's sign. No cords or calf tenderness. No significant calf/ankle edema. SCDs in place.  Neurologic: Reflexes intact, no clonus present.    Recent Labs    02/16/19 0455 02/16/19 1325  HGB 13.2 14.5  HCT 38.2 42.7    Assessment/Plan: Continue Magnesium sulfate for 24 hrs postpartum.  Has had multiple elevated BPs requiring treatment with IV hydralazine.Was changed to Procardia XL BID dosing. Current BP 125/74. Continue to monitor. Will likely need to continue postpartum after discharge.  Continue neuro checks.  Desires BTL for contraception.  Surgery schedule moved to tomorrow when patient is off Magnesium.  Can begin ambulation after discontinuation of Magnesium drip and foley catheter.  Desires circumcision for female infant. Will likely need to have performed outpatient due to infant size.    LOS: 1 day   Rubie Maid, MD [facility] 02/16/2019 6:03 PM

## 2019-02-16 NOTE — Anesthesia Postprocedure Evaluation (Signed)
Anesthesia Post Note  Patient: Andrea Rivera  Procedure(s) Performed: AN AD [place]  Patient location during evaluation: Mother Baby Anesthesia Type: Epidural Level of consciousness: awake and alert Pain management: pain level controlled Vital Signs Assessment: post-procedure vital signs reviewed and stable Respiratory status: spontaneous breathing, nonlabored ventilation and respiratory function stable Cardiovascular status: stable Postop Assessment: no headache, no backache and epidural receding Anesthetic complications: no     Last Vitals:  Vitals:   02/16/19 0723 02/16/19 0725  BP: (!) 150/87   Pulse: 76   Resp: 18   Temp: 36.6 C   SpO2:  96%    Last Pain:  Vitals:   02/16/19 0723  TempSrc: Oral  PainSc:                  Brantley Fling

## 2019-02-17 ENCOUNTER — Inpatient Hospital Stay: Payer: Medicaid Other | Admitting: Anesthesiology

## 2019-02-17 ENCOUNTER — Encounter: Payer: Self-pay | Admitting: Obstetrics and Gynecology

## 2019-02-17 ENCOUNTER — Encounter
Admission: EM | Disposition: A | Discharge status: Home / Self Care | Payer: Self-pay | Source: Home / Self Care | Attending: Obstetrics and Gynecology

## 2019-02-17 DIAGNOSIS — Z302 Encounter for sterilization: Secondary | ICD-10-CM

## 2019-02-17 HISTORY — PX: TUBAL LIGATION: SHX77

## 2019-02-17 LAB — SURGICAL PATHOLOGY

## 2019-02-17 LAB — MAGNESIUM: Magnesium: 5.5 mg/dL — ABNORMAL HIGH (ref 1.7–2.4)

## 2019-02-17 SURGERY — LIGATION, FALLOPIAN TUBE, POSTPARTUM
Anesthesia: General | Site: Abdomen | Laterality: Bilateral

## 2019-02-17 MED ORDER — PHENYLEPHRINE HCL (PRESSORS) 10 MG/ML IV SOLN
INTRAVENOUS | Status: DC | PRN
  Administered 2019-02-17: 100 ug via INTRAVENOUS
  Administered 2019-02-17: 150 ug via INTRAVENOUS

## 2019-02-17 MED ORDER — ACETAMINOPHEN 10 MG/ML IV SOLN
INTRAVENOUS | Status: DC | PRN
  Administered 2019-02-17: 1000 mg via INTRAVENOUS

## 2019-02-17 MED ORDER — OXYCODONE HCL 5 MG/5ML PO SOLN: 5.0000 mg | Freq: Once | ORAL | Status: DC | PRN

## 2019-02-17 MED ORDER — ACETAMINOPHEN-CODEINE #3 300-30 MG PO TABS
1.0000 | ORAL_TABLET | Freq: Four times a day (QID) | ORAL | Status: DC | PRN
  Administered 2019-02-17: 1 via ORAL
  Filled 2019-02-17: qty 1

## 2019-02-17 MED ORDER — OXYCODONE HCL 5 MG PO TABS: 5.0000 mg | ORAL_TABLET | Freq: Once | ORAL | Status: DC | PRN

## 2019-02-17 MED ORDER — BUPIVACAINE HCL 0.5 % IJ SOLN
INTRAMUSCULAR | Status: DC | PRN
  Administered 2019-02-17: 10 mL

## 2019-02-17 MED ORDER — DEXAMETHASONE SODIUM PHOSPHATE 10 MG/ML IJ SOLN
INTRAMUSCULAR | Status: DC | PRN
  Administered 2019-02-17: 8 mg via INTRAVENOUS

## 2019-02-17 MED ORDER — ACETAMINOPHEN-CODEINE #3 300-30 MG PO TABS: 1.0000 | ORAL_TABLET | Freq: Four times a day (QID) | ORAL | 0 refills | Status: AC | PRN

## 2019-02-17 MED ORDER — LIDOCAINE HCL (CARDIAC) PF 100 MG/5ML IV SOSY
PREFILLED_SYRINGE | INTRAVENOUS | Status: DC | PRN
  Administered 2019-02-17: 80 mg via INTRAVENOUS

## 2019-02-17 MED ORDER — PROPOFOL 10 MG/ML IV BOLUS
INTRAVENOUS | Status: DC | PRN
  Administered 2019-02-17: 60 mg via INTRAVENOUS
  Administered 2019-02-17: 140 mg via INTRAVENOUS

## 2019-02-17 MED ORDER — FENTANYL CITRATE (PF) 100 MCG/2ML IJ SOLN
INTRAMUSCULAR | Status: DC | PRN
  Administered 2019-02-17: 100 ug via INTRAVENOUS

## 2019-02-17 MED ORDER — ROCURONIUM BROMIDE 50 MG/5ML IV SOLN
INTRAVENOUS | Status: AC
  Filled 2019-02-17: qty 1

## 2019-02-17 MED ORDER — ACETAMINOPHEN NICU IV SYRINGE 10 MG/ML
INTRAVENOUS | Status: AC
  Filled 2019-02-17: qty 1

## 2019-02-17 MED ORDER — IBUPROFEN 800 MG PO TABS: 800.0000 mg | ORAL_TABLET | Freq: Three times a day (TID) | ORAL | 1 refills | Status: AC | PRN

## 2019-02-17 MED ORDER — ONDANSETRON HCL 4 MG/2ML IJ SOLN
INTRAMUSCULAR | Status: DC | PRN
  Administered 2019-02-17: 4 mg via INTRAVENOUS

## 2019-02-17 MED ORDER — ROCURONIUM BROMIDE 100 MG/10ML IV SOLN
INTRAVENOUS | Status: DC | PRN
  Administered 2019-02-17: 30 mg via INTRAVENOUS

## 2019-02-17 MED ORDER — SUCCINYLCHOLINE CHLORIDE 20 MG/ML IJ SOLN
INTRAMUSCULAR | Status: AC
  Filled 2019-02-17: qty 1

## 2019-02-17 MED ORDER — PHENYLEPHRINE HCL (PRESSORS) 10 MG/ML IV SOLN
INTRAVENOUS | Status: AC
  Filled 2019-02-17: qty 1

## 2019-02-17 MED ORDER — FENTANYL CITRATE (PF) 100 MCG/2ML IJ SOLN: 25.0000 ug | INTRAMUSCULAR | Status: DC | PRN

## 2019-02-17 MED ORDER — GLYCOPYRROLATE 0.2 MG/ML IJ SOLN
INTRAMUSCULAR | Status: AC
  Filled 2019-02-17: qty 1

## 2019-02-17 MED ORDER — ONDANSETRON HCL 4 MG/2ML IJ SOLN
INTRAMUSCULAR | Status: AC
  Filled 2019-02-17: qty 2

## 2019-02-17 MED ORDER — FENTANYL CITRATE (PF) 100 MCG/2ML IJ SOLN
INTRAMUSCULAR | Status: AC
  Filled 2019-02-17: qty 2

## 2019-02-17 MED ORDER — DEXAMETHASONE SODIUM PHOSPHATE 4 MG/ML IJ SOLN
INTRAMUSCULAR | Status: AC
  Filled 2019-02-17: qty 2

## 2019-02-17 MED ORDER — SUCCINYLCHOLINE CHLORIDE 20 MG/ML IJ SOLN
INTRAMUSCULAR | Status: DC | PRN
  Administered 2019-02-17: 100 mg via INTRAVENOUS

## 2019-02-17 MED ORDER — LIDOCAINE HCL (PF) 2 % IJ SOLN
INTRAMUSCULAR | Status: AC
  Filled 2019-02-17: qty 10

## 2019-02-17 MED ORDER — MIDAZOLAM HCL 2 MG/2ML IJ SOLN
INTRAMUSCULAR | Status: DC | PRN
  Administered 2019-02-17: 2 mg via INTRAVENOUS

## 2019-02-17 MED ORDER — MIDAZOLAM HCL 2 MG/2ML IJ SOLN
INTRAMUSCULAR | Status: AC
  Filled 2019-02-17: qty 2

## 2019-02-17 MED ORDER — SUGAMMADEX SODIUM 200 MG/2ML IV SOLN
INTRAVENOUS | Status: AC
  Filled 2019-02-17: qty 2

## 2019-02-17 MED ORDER — PROPOFOL 10 MG/ML IV BOLUS
INTRAVENOUS | Status: AC
  Filled 2019-02-17: qty 40

## 2019-02-17 MED ORDER — SUGAMMADEX SODIUM 200 MG/2ML IV SOLN
INTRAVENOUS | Status: DC | PRN
  Administered 2019-02-17: 200 mg via INTRAVENOUS

## 2019-02-17 SURGICAL SUPPLY — 32 items
BLADE SURG SZ11 CARB STEEL (BLADE) ×3 IMPLANT
CHLORAPREP W/TINT 26 (MISCELLANEOUS) ×3 IMPLANT
CLOSURE WOUND 1/2 X4 (GAUZE/BANDAGES/DRESSINGS) ×1
COVER WAND RF STERILE (DRAPES) ×3 IMPLANT
DERMABOND ADVANCED (GAUZE/BANDAGES/DRESSINGS) ×2
DERMABOND ADVANCED .7 DNX12 (GAUZE/BANDAGES/DRESSINGS) ×1 IMPLANT
DRAPE LAPAROTOMY 100X77 ABD (DRAPES) ×3 IMPLANT
DRSG TEGADERM 2-3/8X2-3/4 SM (GAUZE/BANDAGES/DRESSINGS) ×3 IMPLANT
GAUZE PETROLATUM 1 X8 (GAUZE/BANDAGES/DRESSINGS) ×3 IMPLANT
GLOVE BIO SURGEON STRL SZ 6.5 (GLOVE) ×2 IMPLANT
GLOVE BIO SURGEONS STRL SZ 6.5 (GLOVE) ×1
GLOVE INDICATOR 7.0 STRL GRN (GLOVE) ×3 IMPLANT
GOWN STRL REUS W/ TWL LRG LVL3 (GOWN DISPOSABLE) ×2 IMPLANT
GOWN STRL REUS W/TWL LRG LVL3 (GOWN DISPOSABLE) ×4
KIT TURNOVER CYSTO (KITS) ×3 IMPLANT
NEEDLE HYPO 25GX1X1/2 BEV (NEEDLE) ×3 IMPLANT
NS IRRIG 500ML POUR BTL (IV SOLUTION) ×3 IMPLANT
PACK BASIN MINOR ARMC (MISCELLANEOUS) ×3 IMPLANT
SPONGE GAUZE 2X2 8PLY STER LF (GAUZE/BANDAGES/DRESSINGS) ×1
SPONGE GAUZE 2X2 8PLY STRL LF (GAUZE/BANDAGES/DRESSINGS) ×2 IMPLANT
SPONGE LAP 18X18 RF (DISPOSABLE) ×3 IMPLANT
STRIP CLOSURE SKIN 1/2X4 (GAUZE/BANDAGES/DRESSINGS) ×2 IMPLANT
SUT MNCRL 4-0 (SUTURE) ×2
SUT MNCRL 4-0 27XMFL (SUTURE) ×1
SUT PLAIN GUT 0 (SUTURE) ×6 IMPLANT
SUT VIC AB 0 CT1 36 (SUTURE) IMPLANT
SUT VIC AB 0 SH 27 (SUTURE) IMPLANT
SUT VIC AB 3-0 SH 27 (SUTURE) ×2
SUT VIC AB 3-0 SH 27X BRD (SUTURE) ×1 IMPLANT
SUT VICRYL 0 AB UR-6 (SUTURE) ×6 IMPLANT
SUTURE MNCRL 4-0 27XMF (SUTURE) ×1 IMPLANT
SYR 10ML LL (SYRINGE) ×3 IMPLANT

## 2019-02-17 NOTE — Anesthesia Procedure Notes (Signed)
Procedure Name: Intubation Performed by: Demetrius Charity, CRNA Pre-anesthesia Checklist: Patient identified, Patient being monitored, Timeout performed, Emergency Drugs available and Suction available Patient Re-evaluated:Patient Re-evaluated prior to induction Oxygen Delivery Method: Circle system utilized Preoxygenation: Pre-oxygenation with 100% oxygen Induction Type: IV induction and Rapid sequence Ventilation: Mask ventilation without difficulty Laryngoscope Size: 3 and McGraph Grade View: Grade I Tube type: Oral Tube size: 6.5 mm Number of attempts: 1 Airway Equipment and Method: Stylet and Video-laryngoscopy Placement Confirmation: ETT inserted through vocal cords under direct vision,  positive ETCO2 and breath sounds checked- equal and bilateral Secured at: 23 cm Tube secured with: Tape Dental Injury: Teeth and Oropharynx as per pre-operative assessment

## 2019-02-17 NOTE — Progress Notes (Signed)
CPR video viewed by mom. Verbalizes understanding of all instruction with appropriate teach back

## 2019-02-17 NOTE — Progress Notes (Addendum)
Post Partum Day # 2, s/p SVD (precipitous).  Eclampsia of pregnancy.   Subjective: no complaints, up ad lib and voiding.  Patient has been NPO since midnight in preparation for her tubal this morning. Denies headaches, blurred vision.   Objective: Vitals:   02/17/19 0010 02/17/19 0045 02/17/19 0145 02/17/19 0405  BP:  (!) 151/79 (!) 149/89 (!) 153/88  Pulse:  78 83 71  Resp:  16 16 18   Temp:   98.2 F (36.8 C) 98 F (36.7 C)  TempSrc:   Oral Oral  SpO2: 95% 97% 99% 97%  Weight:      Height:        Physical Exam:  General: alert and no distress. Mild facial puffiness present Lungs: clear to auscultation bilaterally Breasts: normal appearance, no masses or tenderness Heart: regular rate and rhythm, S1, S2 normal, no murmur, click, rub or gallop Abdomen: soft, non-tender; bowel sounds normal; no masses,  no organomegaly Pelvis: Lochia: appropriate, Uterine Fundus: firm Extremities: DVT Evaluation: No evidence of DVT seen on physical exam. Negative Homan's sign. No cords or calf tenderness. No significant calf/ankle edema. SCDs in place.  Neurologic: Reflexes intact, no clonus present.    Recent Labs    02/16/19 0455 02/16/19 1325  HGB 13.2 14.5  HCT 38.2 42.7    Assessment/Plan: S/p Magnesium sulfate for 24 hrs postpartum.  Continue Procardia XL. May change from 30 mg BID (total of 60 mg) to 90 mg daily dosing if BPs continue to be moderately elevated.    Desires BTL for contraception.   Other reversible forms of contraception have been previously discussed with patient; she declines all other modalities. Risks of procedure discussed with patient including but not limited to: risk of regret, permanence of method, bleeding, infection, injury to surrounding organs and need for additional procedures.  Failure risk of about 1% with increased risk of ectopic gestation if pregnancy occurs was also discussed with patient.  Also discussed possibility of post-tubal pain syndrome.  Patient verbalized understanding of these risks and wants to proceed with sterilization.  Written informed consent obtained.  To OR when ready. Continue to encourage ambulation.  Desires circumcision for female infant. Will likely need to have performed outpatient due to infant size.  May possibly be able to go home today if BPs are better controlled  and if baby is up for discharge.    LOS: 2 days   Rubie Maid, MD [facility] 02/17/2019 7:29 AM

## 2019-02-17 NOTE — Anesthesia Post-op Follow-up Note (Signed)
Anesthesia QCDR form completed.        

## 2019-02-17 NOTE — Anesthesia Postprocedure Evaluation (Signed)
Anesthesia Post Note  Patient: Andrea Rivera  Procedure(s) Performed: POST PARTUM TUBAL LIGATION (Bilateral Abdomen)  Patient location during evaluation: PACU Anesthesia Type: General Level of consciousness: awake and alert Pain management: pain level controlled Vital Signs Assessment: post-procedure vital signs reviewed and stable Respiratory status: spontaneous breathing, nonlabored ventilation and respiratory function stable Cardiovascular status: blood pressure returned to baseline and stable Postop Assessment: no apparent nausea or vomiting Anesthetic complications: no     Last Vitals:  Vitals:   02/17/19 0940 02/17/19 1139  BP: 120/77 (!) 141/93  Pulse: (!) 101 87  Resp: 18 18  Temp: 36.7 C 36.8 C  SpO2: 96% 98%    Last Pain:  Vitals:   02/17/19 1139  TempSrc: Oral  PainSc:                  [name]

## 2019-02-17 NOTE — Op Note (Signed)
Procedure(s): POST PARTUM TUBAL LIGATION Procedure Note  Andrea Rivera female 28 y.o. 02/17/2019  Indications: The patient is a 28 y.o. G23P3003 female, PPD#2 with eclampsia of pregnancy, undesired fertility, moderate obesity  Pre-operative Diagnosis: Eclampsia of pregnancy, undesired fertility, moderate obesity  Post-operative Diagnosis: Same  Surgeon: Rubie Maid, MD  Assistants:  Surgical Scrub Tech  Anesthesia: General endotracheal anesthesia  Findings: The uterus was noted to be 1-2 cm below umbilicus.  Normal appearing fallopian tubes bilaterally.  Copious clear peritoneal fluid present  Procedure Details: The patient was seen in the Holding Room. The risks, benefits, complications, treatment options, and expected outcomes were discussed with the patient.  The patient concurred with the proposed plan, giving informed consent.  The site of surgery properly noted/marked. The patient was taken to the Operating Room, identified as Andrea Rivera and the procedure verified as Procedure(s) (LRB): POST PARTUM TUBAL LIGATION (Bilateral).    The patient was taken to the operating room where she was placed under general anesthesia without difficulty.  She was then placed in the dorsal supine position and prepped and draped in sterile fashion.  After an adequate timeout was performed, attention was turned to the patient's abdomen where a small transverse skin incision was made under the umbilical fold. The incision was taken down to the layer of fascia using the scalpel, and fascia was incised, and extended bilaterally using Mayo scissors. The edges of the fascial incision were tagged with a 0-Vicryl. The peritoneum was entered in a blunt fashion. Copious peritoneal fluid was present and drained (~ 500 cc, secondary to eclampsia). Attention was then turned to the patient's uterus, and left fallopian tube was identified and followed out to the fimbriated end. The patient's right fallopian tube  was identified and grasped with a Babcock clamp.  The tube was then followed out to the fimbriated end.  The Babcock clamp was then used to grasp the tube approximately 4 cm from the cornual region.  A 3 cm segment of tube was then ligated with a free tie of 0-Chromic using the Pomeroy method and excised.  The left fallopian tube was then ligated in a similar fashion and excised. The tubal lumens were cauterized bilaterally.  Good hemostasis was noted with bilateral fallopian tubes.   The instruments were then removed from the patient's abdomen and the fascial incision was repaired with 0-Vicryl, and the subcutaneous tissue layer was closed with 3-0 Vicryl. The skin was closed with a 4-0 Vicryl subcuticular stitch. The patient tolerated the procedure well.  Instrument, sponge, and needle counts were correct times two.  The patient was then taken to the recovery room awake and in stable condition.   Estimated Blood Loss:  10 cc      Drains: None.           Total IV Fluids: 1300 ml  Specimens: Segments of bilateral fallopian tubes         Implants: None         Complications:  None; patient tolerated the procedure well.         Disposition: PACU - hemodynamically stable.         Condition: stable   Rubie Maid, MD [facility]

## 2019-02-17 NOTE — Transfer of Care (Signed)
Immediate Anesthesia Transfer of Care Note  Patient: Andrea Rivera  Procedure(s) Performed: POST PARTUM TUBAL LIGATION (Bilateral Abdomen)  Patient Location: PACU  Anesthesia Type:General  Level of Consciousness: awake, alert  and oriented  Airway & Oxygen Therapy: Patient Spontanous Breathing and Patient connected to face mask oxygen  Post-op Assessment: Report given to RN and Post -op Vital signs reviewed and stable  Post vital signs: Reviewed and stable  Last Vitals:  Vitals Value Taken Time  BP 139/86 02/17/19 0858  Temp    Pulse 108 02/17/19 0902  Resp    SpO2 97 % 02/17/19 0902  Vitals shown include unvalidated device data.  Last Pain:  Vitals:   02/17/19 0405  TempSrc: Oral  PainSc:          Complications: No apparent anesthesia complications

## 2019-02-17 NOTE — Anesthesia Preprocedure Evaluation (Signed)
Anesthesia Evaluation  Patient identified by MRN, date of birth, ID band Patient awake    Reviewed: Allergy & Precautions, H&P , NPO status , Patient's Chart, lab work & pertinent test results  Airway Mallampati: III  TM Distance: >3 FB Neck ROM: full    Dental  (+) Teeth Intact   Pulmonary neg pulmonary ROS,           Cardiovascular hypertension (Eclampsia, finished Mg therapy last night),      Neuro/Psych Seizures - (Eclampsia),  negative psych ROS   GI/Hepatic negative GI ROS, Neg liver ROS,   Endo/Other  negative endocrine ROS  Renal/GU      Musculoskeletal   Abdominal   Peds  Hematology negative hematology ROS (+)   Anesthesia Other Findings Past Medical History: No date: Medical history non-contributory  History reviewed. No pertinent surgical history.  BMI    Body Mass Index: 36.32 kg/m      Reproductive/Obstetrics Post-partum                             Anesthesia Physical Anesthesia Plan  ASA: III  Anesthesia Plan: General ETT   Post-op Pain Management:    Induction:   PONV Risk Score and Plan: Ondansetron, Dexamethasone, Midazolam and Treatment may vary due to age or medical condition  Airway Management Planned:   Additional Equipment:   Intra-op Plan:   Post-operative Plan:   Informed Consent: I have reviewed the patients History and Physical, chart, labs and discussed the procedure including the risks, benefits and alternatives for the proposed anesthesia with the patient or authorized representative who has indicated his/her understanding and acceptance.     Dental Advisory Given  Plan Discussed with: Anesthesiologist and CRNA  Anesthesia Plan Comments:         Anesthesia Quick Evaluation

## 2019-02-18 MED ORDER — NIFEDIPINE ER 30 MG PO TB24: 30.0000 mg | ORAL_TABLET | Freq: Two times a day (BID) | ORAL | 0 refills | Status: DC

## 2019-02-18 NOTE — Discharge Instructions (Signed)
General Postpartum Discharge Instructions  Do not drink alcohol or take tranquilizers.  Do not take medicine that has not been prescribed by your doctor.  Take showers instead of baths until your doctor gives you permission to take baths.  No sexual intercourse or placement of anything in the vagina for 6 weeks or as instructed by your doctor. Only take prescription or over-the-counter medicines  for pain, discomfort, or fever as directed by your doctor. Take medicines (antibiotics) that kill germs if they are prescribed for you.   Call the office or go to the Emergency Room if:  You feel sick to your stomach (nauseous).  You start to throw up (vomit).  You have trouble eating or drinking.  You have an oral temperature above 101.  You have constipation that is not helped by adjusting diet or increasing fluid intake. Pain medicines are a common cause of constipation.  You have foul smelling vaginal discharge or odor.  You have bleeding requiring changing more than 1 pad per hour. You have any other concerns.  SEEK IMMEDIATE MEDICAL CARE IF:  You have persistent dizziness.  You have difficulty breathing or shortness of breath.  You have an oral temperature above 102.5, not controlled by medicine.    Call your doctor for increased pain or vaginal bleeding, temperature above 100.4, depression, vision changes or blurry vision, dizziness, severe headache, new or increased swelling, or pain at the top of your abdomen.  No strenuous activity or heavy lifting for 6 weeks.  No intercourse, tampons, or douching for 6 weeks.  No tub baths- showers only.  No driving for 2 weeks.  Continue prenatal vitamin and iron.  Call your doctor for incision concerns including redness, swelling, bleeding or drainage, or if begins to come apart.

## 2019-02-18 NOTE — Progress Notes (Signed)
Discharge instructions provided.  Pt verbalizes understanding of all instructions and follow-up care.  Pt discharged to home with infant at 1635 on 02/18/19 via wheelchair by RN. Reed Breech, RN 02/18/2019 5:37 PM

## 2019-02-18 NOTE — Discharge Summary (Signed)
Discharge Summary  Date of Admission: 02/15/2019  Date of Discharge: 02/18/2019  Admitting Diagnosis: Eclampsia at [redacted]w[redacted]d  Mode of Delivery: normal spontaneous vaginal delivery                 Discharge Diagnosis: Eclampsia   Intrapartum Procedures: Atificial rupture of membranes, epidural and Foley bulb and misoprostol   Post partum procedures: postpartum tubal ligation  Complications: Eclampsia, HTN                      Discharge Day SOAP Note:  Progress Note - Vaginal Delivery  Andrea Rivera is a 28 y.o. G3P3003 now PP day 2 s/p Vaginal, Spontaneous . Delivery was complicated by hypertension and Eclampsia  Subjective  The patient has the following complaints: has no unusual complaints  Pain is controlled with current medications.   Patient is urinating without difficulty.  She is ambulating well.    Objective  Vital signs: BP (!) 165/100 (BP Location: Left Arm) Comment: nurse Lazarus Gowda notified  Pulse 65   Temp 98.8 F (37.1 C) (Oral)   Resp 18   Ht 5\' 5"  (1.651 m)   Wt 99 kg   LMP 05/31/2018 (Approximate)   SpO2 98% Comment: Room Air  Breastfeeding Unknown   BMI 36.32 kg/m   Physical Exam: Gen: NAD Fundus Fundal Tone: Firm  Lochia Amount: Small  Perineum Appearance: Intact     Data Review Labs: CBC Latest Ref Rng & Units 02/16/2019 02/16/2019 02/15/2019  WBC 4.0 - 10.5 K/uL 11.9(H) 16.8(H) 10.2  Hemoglobin 12.0 - 15.0 g/dL 14.5 13.2 13.0  Hematocrit 36.0 - 46.0 % 42.7 38.2 38.6  Platelets 150 - 400 K/uL 169 206 213   B POS  Assessment/Plan  Active Problems:   Labor and delivery indication for care or intervention   Eclampsia affecting pregnancy in third trimester   Group B streptococcal carriage complicating pregnancy    Plan for discharge today.   Discharge Instructions: Per After Visit Summary. Activity: Advance as tolerated. Pelvic rest for 6 weeks.  Also refer to After Visit Summary Diet: Regular  Medications: Allergies as of 02/18/2019   No Known Allergies     Medication List    STOP taking these medications   multivitamin-iron-minerals-folic acid chewable tablet     TAKE these medications   acetaminophen-codeine 300-30 MG tablet Commonly known as: TYLENOL #3 Take 1-2 tablets by mouth every 6 (six) hours as needed for moderate pain.   ibuprofen 800 MG tablet Commonly known as: ADVIL Take 1 tablet (800 mg total) by mouth every 8 (eight) hours as needed.   NIFEdipine 30 MG 24 hr tablet Commonly known as: ADALAT CC Take 1 tablet (30 mg total) by mouth 2 (two) times daily for 30 days.   Vitafol Gummies 3.33-0.333-34.8 MG Chew Chew 1 tablet by mouth daily.      Outpatient follow up:  Follow-up Information    Rubie Maid, MD Follow up in 1 week(s).   Specialties: Obstetrics and Gynecology, Radiology Why: 3-5 days for BP check and incision check Contact information: [place] [address] [phone]          Postpartum contraception: Will discuss at first office visit post-partum  Discharged Condition: good  Discharged to: home  Newborn Data: Disposition:home with mother  Apgars:  APGAR (1 MIN): 4   APGAR (5 MINS): 8   APGAR (10 MINS):    Baby Feeding: Bottle    Finis Bud, M.D. 02/18/2019 8:53 AM

## 2019-02-18 NOTE — Clinical Social Work Maternal (Signed)
CLINICAL SOCIAL WORK MATERNAL/CHILD NOTE  Patient Details  Name: Andrea Rivera MRN: [id number] Date of Birth: 08/26/1991  Date:  02/18/2019  Clinical Social Worker Initiating Note:  Bailey Sample Date/Time: Initiated:  02/18/19/      Child's Name:  Andrea Rivera   Biological Parents:  Mother   Need for Interpreter:  None   Reason for Referral:  Other (Comment)(Scored 9 on depression screen.)   Address:  [address] [place] [address]    Phone number:  [phone] (home)     Additional phone number:   Household Members/Support Persons (HM/SP):       HM/SP Name Relationship DOB or Age  HM/SP -1        HM/SP -2        HM/SP -3        HM/SP -4        HM/SP -5        HM/SP -6        HM/SP -7        HM/SP -8          Natural Supports (not living in the home):  Parent   Professional Supports:     Employment: Full-time   Type of Work:     Education:  High school graduate   Homebound arranged:    Financial Resources:  Medicaid   Other Resources:      Cultural/Religious Considerations Which May Impact Care:    Strengths:  Ability to meet basic needs    Psychotropic Medications:         Pediatrician:       Pediatrician List:   [place]    [place]    [place]    [place]    [place]    [place]      Pediatrician Fax Number:    Risk Factors/Current Problems:  Other (Comment)(Unsafe parenting)   Cognitive State:      Mood/Affect:  Flat (Guarded)   CSW Assessment: Clinical Social Worker (CSW) received consult that mother scored a 9 on depression screen. Per RN mother left the newborn on the hospital bed with a bottle propped up alone while mother went out to the nurses station to find the nurse. Per RN newborn has a low body tempeture and needs to be monitored at the hospital and mother wants to take the newborn and herself home despite newborn's medical concerns. CSW met with mother at bedside and she was  holding her newborn son. CSW asked what the name of her newborn son was and mother said she didn't know then she said she came up with the name yesterday Andrea. CSW did education with mother about how it is unsafe to leave the newborn alone with a bottle propped. Mother appeared guarded and had a flat affect. Mother reported that she has an 30 y.o and 30 y.o at home that her mother is watching them. Per mother the father of the newborn is not involved. Per mother she has all the supplies needed for a newborn. CSW provided mother with a list of resources including mental health clinics. Per mother she is not depressed and is not having thoughts of hurting herself. Per mother her mother will be helping her get settled in at home.   RN called CSW later on after assessment was complete and reported that mother is leaving the hospital and said she can't stay any longer and to call her when the newborn is ready to be picked   up. CSW made a Child Protective Services (CPS) report in [place] due to mother's unsafe parenting. CSW will continue to follow and assist as needed.   CSW Plan/Description:  Child Protective Service Report     Sample, Bailey M, LCSW 02/18/2019, 4:44 PM  

## 2019-02-20 LAB — SURGICAL PATHOLOGY

## 2019-02-22 ENCOUNTER — Telehealth: Payer: Self-pay | Admitting: Obstetrics and Gynecology

## 2019-02-22 NOTE — Telephone Encounter (Signed)
The patient called and stated that she needs to find out more information for her son she just had a week ago. The patient stated that she need information on him getting circumcised and medicaid. Please advise. Pt is requesting a call back.

## 2019-02-27 NOTE — Telephone Encounter (Signed)
Pt called and a small child answered the phone. Asked the child several times to speak to someone and the child was speaking baby talk and hung.

## 2019-03-01 NOTE — Telephone Encounter (Signed)
Pt called and had already got the number and information about the circumcision outpatient clinic. Pt has an appointment today.

## 2019-04-05 ENCOUNTER — Ambulatory Visit (INDEPENDENT_AMBULATORY_CARE_PROVIDER_SITE_OTHER): Payer: Medicaid Other | Admitting: Obstetrics and Gynecology

## 2019-04-05 ENCOUNTER — Encounter: Payer: Self-pay | Admitting: Obstetrics and Gynecology

## 2019-04-05 ENCOUNTER — Other Ambulatory Visit: Payer: Self-pay

## 2019-04-05 DIAGNOSIS — Z8759 Personal history of other complications of pregnancy, childbirth and the puerperium: Secondary | ICD-10-CM

## 2019-04-05 DIAGNOSIS — L918 Other hypertrophic disorders of the skin: Secondary | ICD-10-CM

## 2019-04-05 MED ORDER — MEDROXYPROGESTERONE ACETATE 150 MG/ML IM SUSP: 150.0000 mg | INTRAMUSCULAR | 3 refills | Status: AC

## 2019-04-05 NOTE — Progress Notes (Signed)
PT is present today for her postpartum visit. Pt stated that she is not breastfeeding and have not had sexually intercourse recently. Pt stated that she is unsure about birth control at this time. EPDS= 0.  Pt stated that she is doing well no complaints.

## 2019-04-05 NOTE — Progress Notes (Signed)
   OBSTETRICS POSTPARTUM CLINIC PROGRESS NOTE  Subjective:     Andrea Rivera is a 28 y.o. G1P3003 female who presents for a postpartum visit. She is 6 weeks postpartum following a spontaneous vaginal delivery. I have fully reviewed the prenatal and intrapartum course. The delivery was at 56 gestational weeks. Pregnancy was complicated by eclampsia.  Anesthesia: epidural. Postpartum course has been well. Baby's course has been well. Baby is feeding by bottle. Bleeding: patient has/has not resumed menses, with LMP 04/03/2019. Bowel function is normal. Bladder function is normal. Patient is not sexually active. Contraception method desired is Depo-Provera injections. Postpartum depression screening: negative (EPDS score is 0).   Additionally, patient desires skin tag on eye that she would like removed.  The following portions of the patient's history were reviewed and updated as appropriate: allergies, current medications, past family history, past medical history, past social history, past surgical history and problem list.  Review of Systems Pertinent items noted in HPI and remainder of comprehensive ROS otherwise negative.   Objective:    BP 118/68   Pulse 84   Ht 5\' 5"  (1.651 m)   Wt 187 lb 8 oz (85 kg)   Breastfeeding No   BMI 31.20 kg/m   General:  alert and no distress   Breasts:  inspection negative, no nipple discharge or bleeding, no masses or nodularity palpable  Lungs: clear to auscultation bilaterally  Heart:  regular rate and rhythm, S1, S2 normal, no murmur, click, rub or gallop  Abdomen: soft, non-tender; bowel sounds normal; no masses,  no organomegaly.    Vulva:  normal  Vagina: normal vagina, no discharge, exudate, lesion, or erythema  Cervix:  no cervical motion tenderness and no lesions  Corpus: normal size, contour, position, consistency, mobility, non-tender  Adnexa:  normal adnexa and no mass, fullness, tenderness  Rectal Exam: Not performed.         Labs:  Lab  Results  Component Value Date   HGB 14.5 02/16/2019     Assessment:   Routine postpartum exam.   History of eclampsia Skin tag of eye  Plan:   1. Contraception: Depo-Provera injections. Will return in 1 week for injection 2. Skin tag removed from eye (lidocaine gel placed over eyelid, skin tag excised, bandage placed). Given instructions for care after.  3. Follow up in: 4 months for annual exam,  or soneras needed.    Rubie Maid, MD [facility]

## 2020-11-27 IMAGING — CT CT HEAD WITHOUT CONTRAST
3 of 4 series · 16 of 47 positions shown, 19 images · non-contrast
Comparison: None.

CLINICAL DATA: Thirty-eight weeks pregnant. Seizure. Head injury.
Hypertension.

EXAM:
CT HEAD WITHOUT CONTRAST
TECHNIQUE: Contiguous axial images were obtained from the base of the skull
through the vertex without intravenous contrast.

[Series 2: head wo · axial · 0.42mm/px · z∈[+73,+198]mm · 10 of 31 slices shown, 13 images]
[im 3/31  brain]
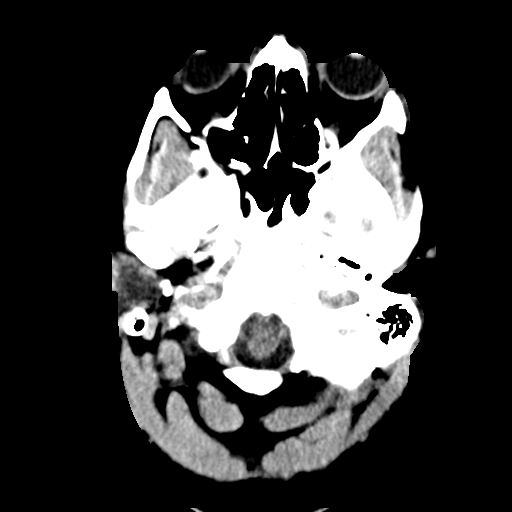
[im 3/31  bone]
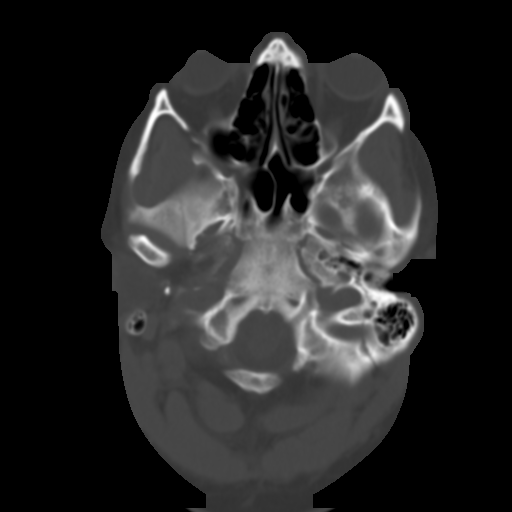
[im 5/31  brain]
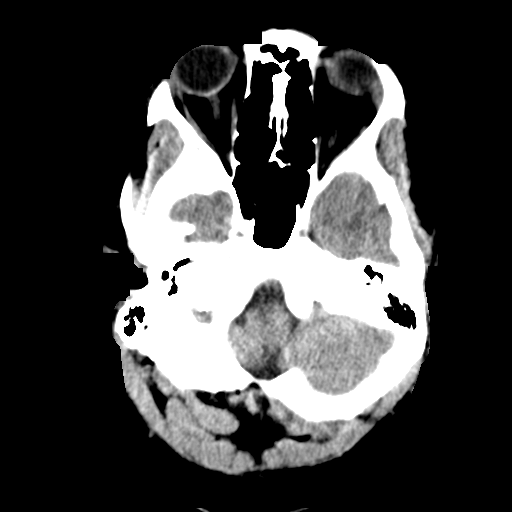
[im 9/31  brain]
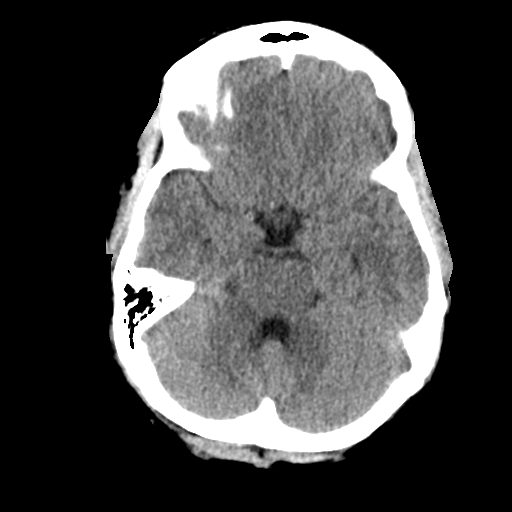
[im 11/31  brain]
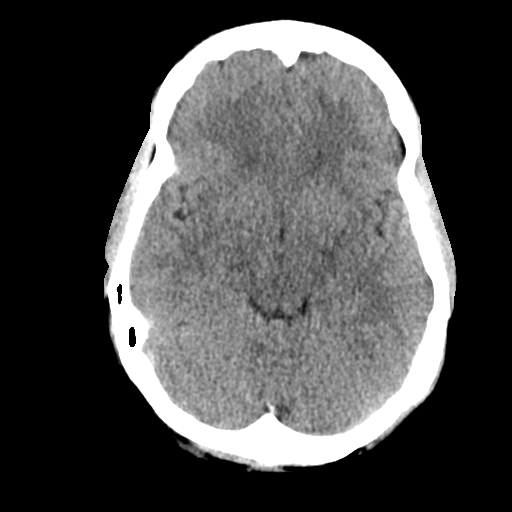
[im 13/31  brain]
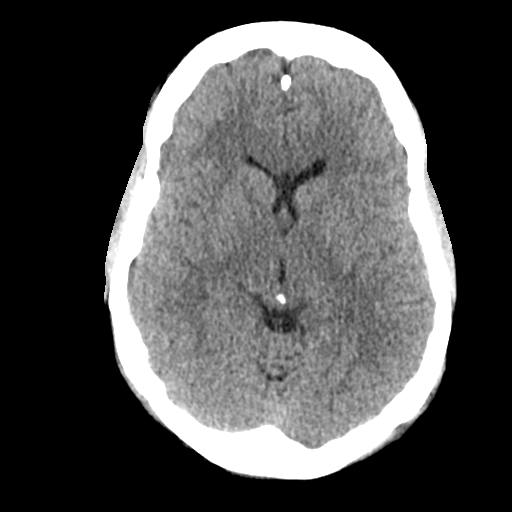
[im 13/31  bone]
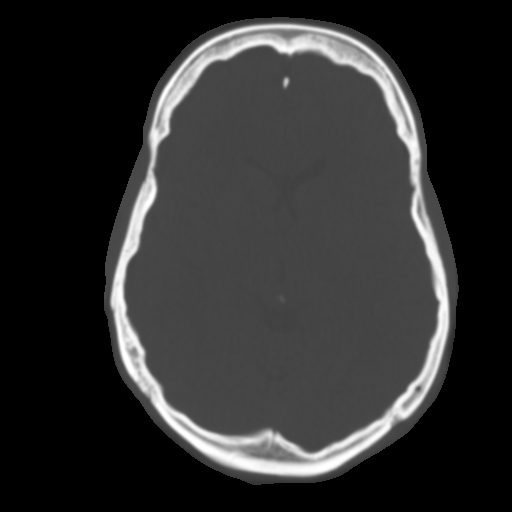
[im 18/31  brain]
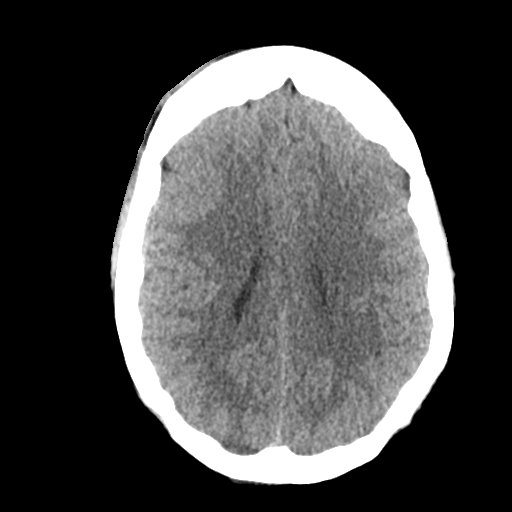
[im 20/31  brain]
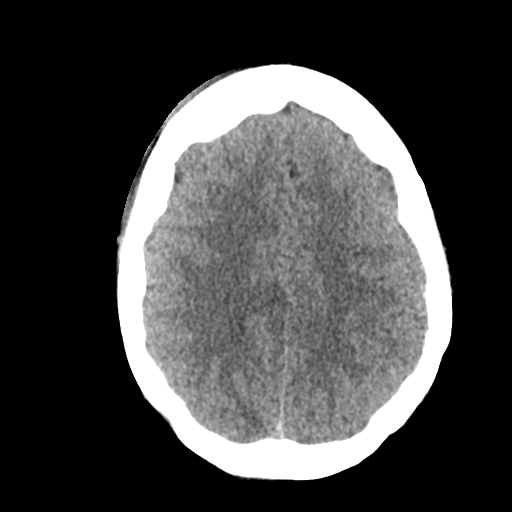
[im 22/31  brain]
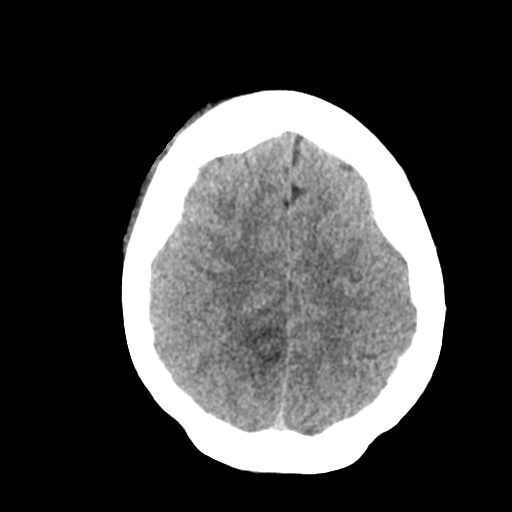
[im 26/31  brain]
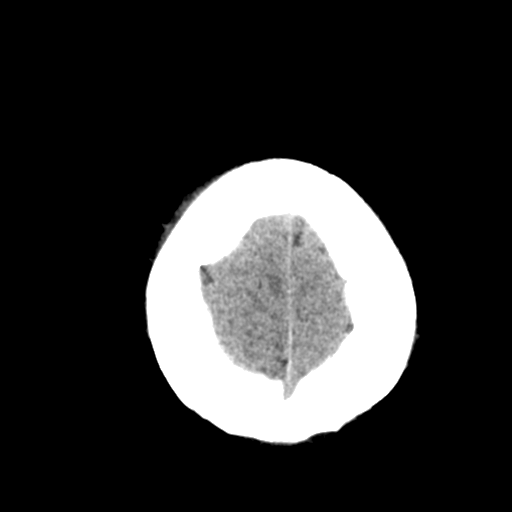
[im 26/31  bone]
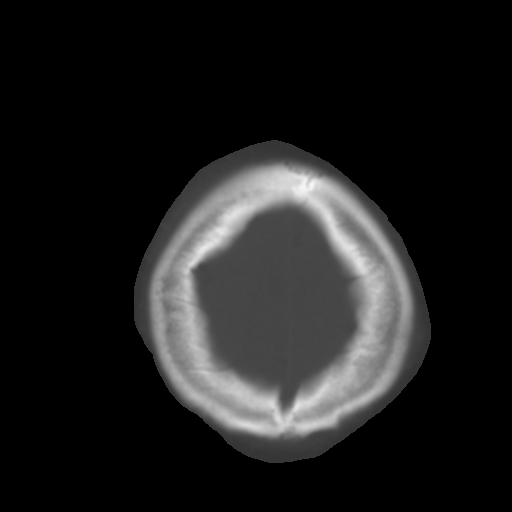
[im 28/31  brain]
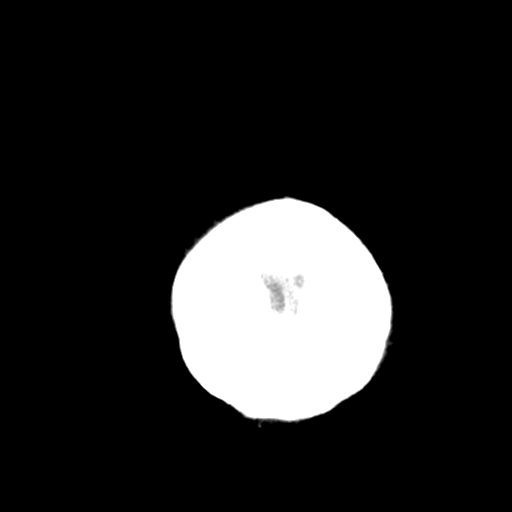

[Series 4: coronal soft tissue · coronal · 0.33mm/px · 3 of 70 slices shown]
[im 24/70  brain]
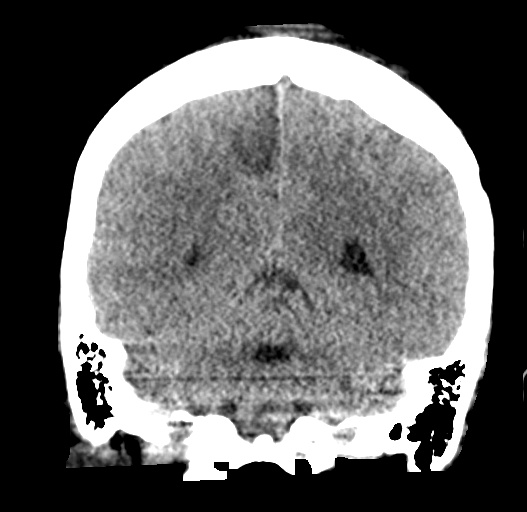
[im 31/70  brain]
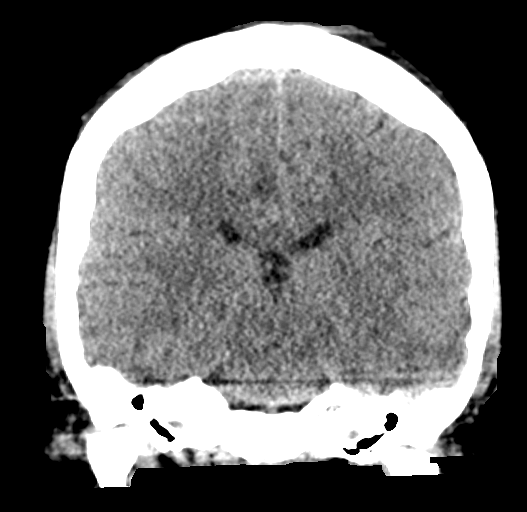
[im 39/70  brain]
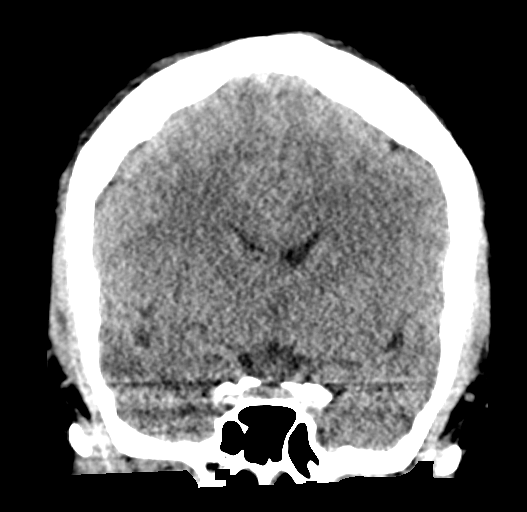

[Series 5: sagittal soft tissue · sagittal · 0.34mm/px · 3 of 57 slices shown]
[im 19/57  brain]
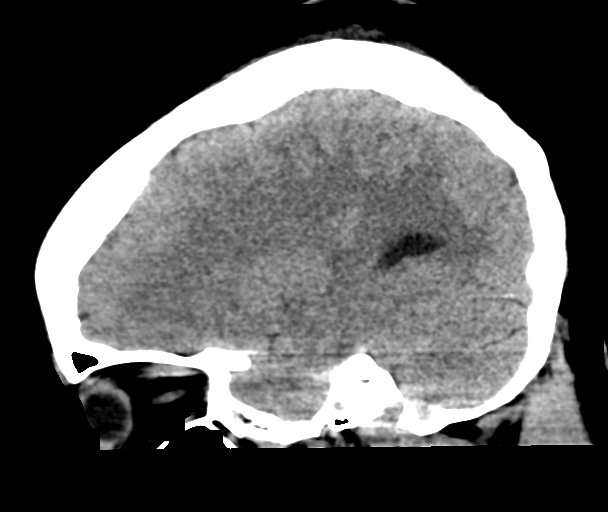
[im 29/57  brain]
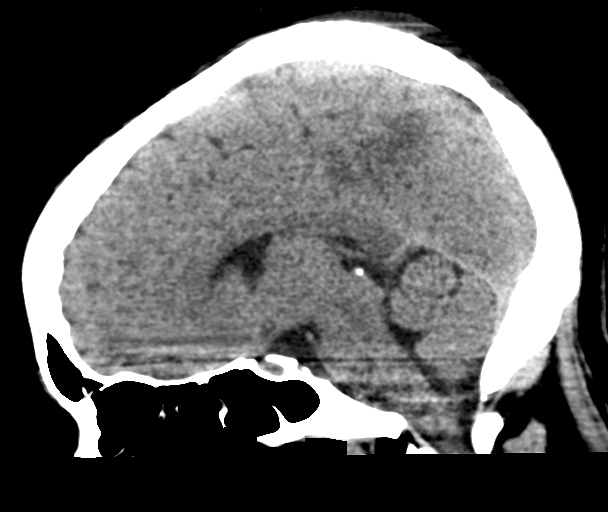
[im 38/57  brain]
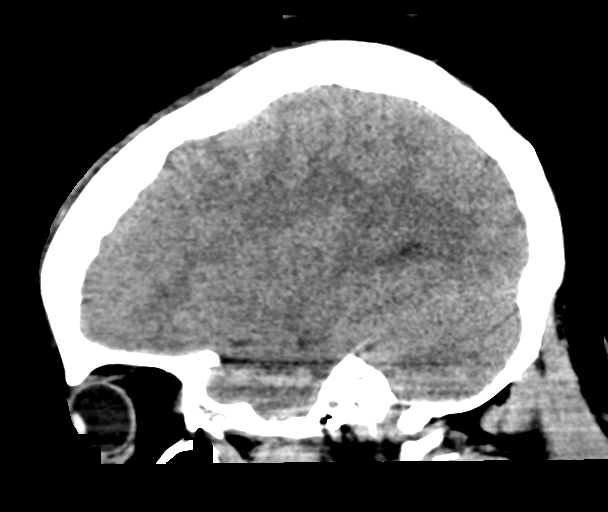

[16 of 47 positions shown; findings below may reference images not displayed]

FINDINGS: Brain: Image quality degraded by motion. Multiple images repeated
due to motion.

Subtle hypodensity in both occipital lobes. More prominent
hypodensity in the right medial parietal convexity. No acute
hemorrhage.

Ventricle size normal.  No midline shift.

Vascular: Negative for hyperdense vessel

Skull: Negative

Sinuses/Orbits: Negative

Other: Right frontal scalp contusion due to head injury.
IMPRESSION: Hypodensity right medial parietal convexity without hemorrhage.
Subtle hypodensity in both occipital lobes. Given the history of
hypertension and pregnancy with seizure, this is most likely due to
posterior reversible encephalopathy syndrome. Recommend MRI brain
without and with contrast for further evaluation.

Right frontal scalp contusion due to head injury from seizure.

These results were called by telephone at the time of interpretation
on 02/15/2019 at [DATE] to Dr. RAMEZ SAITO , who verbally
acknowledged these results.

## 2023-02-15 ENCOUNTER — Ambulatory Visit: Payer: Medicaid Other

## 2023-02-17 ENCOUNTER — Ambulatory Visit: Payer: Medicaid Other | Admitting: Family Medicine

## 2023-02-17 DIAGNOSIS — Z708 Other sex counseling: Secondary | ICD-10-CM

## 2023-02-17 DIAGNOSIS — Z1159 Encounter for screening for other viral diseases: Secondary | ICD-10-CM

## 2023-02-17 DIAGNOSIS — Z113 Encounter for screening for infections with a predominantly sexual mode of transmission: Secondary | ICD-10-CM

## 2023-02-17 DIAGNOSIS — Z114 Encounter for screening for human immunodeficiency virus [HIV]: Secondary | ICD-10-CM

## 2023-02-17 LAB — HEPATITIS B SURFACE ANTIGEN

## 2023-02-17 LAB — HM HEPATITIS C SCREENING LAB: HM Hepatitis Screen: NEGATIVE

## 2023-02-17 LAB — HM HIV SCREENING LAB: HM HIV Screening: NEGATIVE

## 2023-02-17 NOTE — Progress Notes (Signed)
STI clinic/screening visit [address] [place] [address] [phone]  Subjective:  Andrea Rivera is a 32 y.o. female being seen today for [facility]. The patient reports they do not have symptoms.    Patient's last menstrual period was 02/17/2023 (exact date).  Patient has the following medical conditions:   Patient Active Problem List   Diagnosis Date Noted   Eclampsia affecting pregnancy in third trimester 02/15/2019   Group B streptococcal carriage complicating pregnancy 02/15/2019   Labor and delivery indication for care or intervention 06/08/2016    Chief Complaint  Patient presents with   SEXUALLY TRANSMITTED DISEASE    No symptoms     Does the patient using douching products? No  Last HIV test per patient/review of record was No results found for: "HMHIVSCREEN"  Lab Results  Component Value Date   HIV Non Reactive 08/05/2018   Patient reports last pap was  Lab Results  Component Value Date   DIAGPAP  09/07/2018    NEGATIVE FOR INTRAEPITHELIAL LESIONS OR MALIGNANCY.   No results found for: "SPECADGYN"  Screening for MPX risk: Does the patient have an unexplained rash? No Is the patient MSM? No Does the patient endorse multiple sex partners or anonymous sex partners? No Did the patient have close or sexual contact with a person diagnosed with MPX? No Has the patient traveled outside the [place] where MPX is endemic? No Is there a high clinical suspicion for MPX-- evidenced by one of the following No  -Unlikely to be chickenpox  -Lymphadenopathy  -Rash that present in same phase of evolution on any given body part See flowsheet for further details and programmatic requirements.   Immunization history:  Immunization History  Administered Date(s) Administered   Tdap 11/30/2018     The following portions of the patient's history were reviewed and updated as appropriate: allergies, current medications, past medical history, past  social history, past surgical history and problem list.  Objective:  There were no vitals filed for this visit.  Patient seen by RN only. Self collected swabs.    Assessment and Plan:  Andrea Rivera is a 32 y.o. female presenting to the [facility] Department for STI screening in [facility]  1. Screen for sexually transmitted diseases  - Chlamydia/Gonorrhea [place] [facility] - HIV/HCV [place] [facility] - Hepatitis Serology, [place] [facility] - Syphilis Serology, [place] [facility] - Chlamydia/Gonorrhea  Lab   Patient accepted all screenings including oral, vaginal CT/GC and bloodwork for HIV/RPR. Patient meets criteria for HepB screening? Yes. Ordered? yes Patient meets criteria for HepC screening? Yes. Ordered? yes  Treat positive results per standing order.  Discussed time line for [facility] results and that patient will be called with positive results and encouraged patient to call if she had not heard in 2 weeks.  Recommended repeat testing in 3 months with positive results. Recommended condom use with all sex  Patient is currently using condoms to prevent pregnancy.    No follow-ups on file.  No future appointments.  Gaspar Garbe, RN

## 2023-02-24 NOTE — Progress Notes (Signed)
Attestation of [facility]: Evaluation and management procedures were performed by the Registered Nurse under [facility] standing orders.  RN independently saw the patient and provided screenings for them without medical exam. Patient self collected specimens. I have reviewed the RN's note and chart, and I agree with screening ordered.   Allister Lessley, FNP
